# Patient Record
Sex: Female | Born: 1960 | Hispanic: No | Marital: Married | Smoking: Never smoker
Health system: Southern US, Community
[De-identification: ages and names within clinical notes are randomized; demographics above are authoritative.]

## PROBLEM LIST (undated history)

## (undated) DIAGNOSIS — D649 Anemia, unspecified: Secondary | ICD-10-CM

## (undated) DIAGNOSIS — F419 Anxiety disorder, unspecified: Secondary | ICD-10-CM

## (undated) DIAGNOSIS — N946 Dysmenorrhea, unspecified: Secondary | ICD-10-CM

## (undated) DIAGNOSIS — N644 Mastodynia: Secondary | ICD-10-CM

## (undated) DIAGNOSIS — N83209 Unspecified ovarian cyst, unspecified side: Secondary | ICD-10-CM

## (undated) DIAGNOSIS — Z87442 Personal history of urinary calculi: Secondary | ICD-10-CM

## (undated) HISTORY — DX: Mastodynia: N64.4

## (undated) HISTORY — DX: Unspecified ovarian cyst, unspecified side: N83.209

## (undated) HISTORY — DX: Anemia, unspecified: D64.9

## (undated) HISTORY — DX: Personal history of urinary calculi: Z87.442

## (undated) HISTORY — PX: SKIN BIOPSY: SHX1

## (undated) HISTORY — PX: WISDOM TOOTH EXTRACTION: SHX21

## (undated) HISTORY — DX: Dysmenorrhea, unspecified: N94.6

## (undated) HISTORY — DX: Anxiety disorder, unspecified: F41.9

---

## 1999-02-21 ENCOUNTER — Encounter: Payer: Self-pay | Admitting: Emergency Medicine

## 1999-02-21 ENCOUNTER — Emergency Department (HOSPITAL_COMMUNITY): Admission: EM | Admit: 1999-02-21 | Discharge: 1999-02-21 | Payer: Self-pay | Admitting: *Deleted

## 1999-04-18 ENCOUNTER — Other Ambulatory Visit: Admission: RE | Admit: 1999-04-18 | Discharge: 1999-04-18 | Payer: Self-pay | Admitting: Obstetrics and Gynecology

## 2000-09-17 ENCOUNTER — Encounter: Payer: Self-pay | Admitting: Internal Medicine

## 2000-09-17 ENCOUNTER — Encounter: Admission: RE | Admit: 2000-09-17 | Discharge: 2000-09-17 | Payer: Self-pay | Admitting: Internal Medicine

## 2000-09-18 ENCOUNTER — Other Ambulatory Visit: Admission: RE | Admit: 2000-09-18 | Discharge: 2000-09-18 | Payer: Self-pay | Admitting: Obstetrics and Gynecology

## 2002-01-05 ENCOUNTER — Encounter: Payer: Self-pay | Admitting: Obstetrics and Gynecology

## 2002-01-05 ENCOUNTER — Encounter: Admission: RE | Admit: 2002-01-05 | Discharge: 2002-01-05 | Payer: Self-pay | Admitting: Obstetrics and Gynecology

## 2002-01-24 ENCOUNTER — Other Ambulatory Visit: Admission: RE | Admit: 2002-01-24 | Discharge: 2002-01-24 | Payer: Self-pay | Admitting: Obstetrics and Gynecology

## 2002-12-17 ENCOUNTER — Emergency Department (HOSPITAL_COMMUNITY): Admission: EM | Admit: 2002-12-17 | Discharge: 2002-12-17 | Payer: Self-pay | Admitting: Emergency Medicine

## 2002-12-17 ENCOUNTER — Encounter: Payer: Self-pay | Admitting: Emergency Medicine

## 2003-01-23 ENCOUNTER — Encounter: Admission: RE | Admit: 2003-01-23 | Discharge: 2003-01-23 | Payer: Self-pay | Admitting: Obstetrics and Gynecology

## 2003-01-23 ENCOUNTER — Encounter: Payer: Self-pay | Admitting: Obstetrics and Gynecology

## 2003-02-27 ENCOUNTER — Other Ambulatory Visit: Admission: RE | Admit: 2003-02-27 | Discharge: 2003-02-27 | Payer: Self-pay | Admitting: Obstetrics and Gynecology

## 2004-02-22 ENCOUNTER — Encounter: Admission: RE | Admit: 2004-02-22 | Discharge: 2004-02-22 | Payer: Self-pay | Admitting: Obstetrics and Gynecology

## 2004-03-05 ENCOUNTER — Other Ambulatory Visit: Admission: RE | Admit: 2004-03-05 | Discharge: 2004-03-05 | Payer: Self-pay | Admitting: Obstetrics and Gynecology

## 2005-04-07 ENCOUNTER — Encounter: Admission: RE | Admit: 2005-04-07 | Discharge: 2005-04-07 | Payer: Self-pay | Admitting: Obstetrics and Gynecology

## 2005-04-14 ENCOUNTER — Other Ambulatory Visit: Admission: RE | Admit: 2005-04-14 | Discharge: 2005-04-14 | Payer: Self-pay | Admitting: Obstetrics and Gynecology

## 2006-02-27 ENCOUNTER — Ambulatory Visit (HOSPITAL_COMMUNITY): Admission: RE | Admit: 2006-02-27 | Discharge: 2006-02-27 | Payer: Self-pay | Admitting: Obstetrics and Gynecology

## 2006-04-14 ENCOUNTER — Encounter: Admission: RE | Admit: 2006-04-14 | Discharge: 2006-04-14 | Payer: Self-pay | Admitting: Obstetrics and Gynecology

## 2006-05-11 ENCOUNTER — Other Ambulatory Visit: Admission: RE | Admit: 2006-05-11 | Discharge: 2006-05-11 | Payer: Self-pay | Admitting: Obstetrics & Gynecology

## 2007-04-27 ENCOUNTER — Encounter: Admission: RE | Admit: 2007-04-27 | Discharge: 2007-04-27 | Payer: Self-pay | Admitting: Obstetrics and Gynecology

## 2007-07-08 ENCOUNTER — Other Ambulatory Visit: Admission: RE | Admit: 2007-07-08 | Discharge: 2007-07-08 | Payer: Self-pay | Admitting: Obstetrics & Gynecology

## 2008-04-27 ENCOUNTER — Encounter: Admission: RE | Admit: 2008-04-27 | Discharge: 2008-04-27 | Payer: Self-pay | Admitting: Obstetrics and Gynecology

## 2008-07-11 ENCOUNTER — Other Ambulatory Visit: Admission: RE | Admit: 2008-07-11 | Discharge: 2008-07-11 | Payer: Self-pay | Admitting: Obstetrics and Gynecology

## 2009-05-09 ENCOUNTER — Encounter: Admission: RE | Admit: 2009-05-09 | Discharge: 2009-05-09 | Payer: Self-pay | Admitting: Obstetrics and Gynecology

## 2010-05-30 ENCOUNTER — Other Ambulatory Visit: Payer: Self-pay | Admitting: Obstetrics & Gynecology

## 2010-05-30 DIAGNOSIS — Z1239 Encounter for other screening for malignant neoplasm of breast: Secondary | ICD-10-CM

## 2010-06-19 ENCOUNTER — Ambulatory Visit
Admission: RE | Admit: 2010-06-19 | Discharge: 2010-06-19 | Disposition: A | Discharge status: Home / Self Care | Payer: BC Managed Care – PPO | Source: Ambulatory Visit | Attending: Obstetrics & Gynecology | Admitting: Obstetrics & Gynecology

## 2010-06-19 DIAGNOSIS — Z1239 Encounter for other screening for malignant neoplasm of breast: Secondary | ICD-10-CM

## 2010-08-23 ENCOUNTER — Inpatient Hospital Stay (INDEPENDENT_AMBULATORY_CARE_PROVIDER_SITE_OTHER)
Admission: RE | Admit: 2010-08-23 | Discharge: 2010-08-23 | Disposition: A | Discharge status: Home / Self Care | Payer: BC Managed Care – PPO | Source: Ambulatory Visit | Attending: Family Medicine | Admitting: Family Medicine

## 2010-08-23 DIAGNOSIS — T148 Other injury of unspecified body region: Secondary | ICD-10-CM

## 2010-08-23 DIAGNOSIS — W57XXXA Bitten or stung by nonvenomous insect and other nonvenomous arthropods, initial encounter: Secondary | ICD-10-CM

## 2010-10-25 HISTORY — PX: COLONOSCOPY: SHX174

## 2011-05-27 ENCOUNTER — Other Ambulatory Visit: Payer: Self-pay | Admitting: Obstetrics & Gynecology

## 2011-05-27 DIAGNOSIS — Z1231 Encounter for screening mammogram for malignant neoplasm of breast: Secondary | ICD-10-CM

## 2011-06-24 ENCOUNTER — Ambulatory Visit: Payer: BC Managed Care – PPO

## 2011-06-25 ENCOUNTER — Ambulatory Visit
Admission: RE | Admit: 2011-06-25 | Discharge: 2011-06-25 | Disposition: A | Discharge status: Home / Self Care | Payer: BC Managed Care – PPO | Source: Ambulatory Visit | Attending: Obstetrics & Gynecology | Admitting: Obstetrics & Gynecology

## 2011-06-25 DIAGNOSIS — Z1231 Encounter for screening mammogram for malignant neoplasm of breast: Secondary | ICD-10-CM

## 2011-09-01 LAB — HM PAP SMEAR: HM Pap smear: NORMAL

## 2012-06-02 ENCOUNTER — Other Ambulatory Visit: Payer: Self-pay | Admitting: Obstetrics & Gynecology

## 2012-06-02 DIAGNOSIS — Z1231 Encounter for screening mammogram for malignant neoplasm of breast: Secondary | ICD-10-CM

## 2012-06-28 ENCOUNTER — Ambulatory Visit
Admission: RE | Admit: 2012-06-28 | Discharge: 2012-06-28 | Disposition: A | Discharge status: Home / Self Care | Payer: BC Managed Care – PPO | Source: Ambulatory Visit | Attending: Obstetrics & Gynecology | Admitting: Obstetrics & Gynecology

## 2012-06-28 DIAGNOSIS — Z1231 Encounter for screening mammogram for malignant neoplasm of breast: Secondary | ICD-10-CM

## 2012-06-28 LAB — HM MAMMOGRAPHY: HM Mammogram: NORMAL

## 2012-09-13 ENCOUNTER — Encounter: Payer: Self-pay | Admitting: *Deleted

## 2012-09-14 ENCOUNTER — Ambulatory Visit (INDEPENDENT_AMBULATORY_CARE_PROVIDER_SITE_OTHER): Payer: BC Managed Care – PPO | Admitting: Nurse Practitioner

## 2012-09-14 ENCOUNTER — Encounter: Payer: Self-pay | Admitting: Nurse Practitioner

## 2012-09-14 VITALS — BP 92/48 | HR 58 | Resp 12 | Ht 63.5 in | Wt 113.4 lb

## 2012-09-14 DIAGNOSIS — Z01419 Encounter for gynecological examination (general) (routine) without abnormal findings: Secondary | ICD-10-CM

## 2012-09-14 DIAGNOSIS — Z0000 Encounter for general adult medical examination without abnormal findings: Secondary | ICD-10-CM

## 2012-09-14 LAB — COMPREHENSIVE METABOLIC PANEL
ALT: 19 U/L (ref 0–35)
AST: 29 U/L (ref 0–37)
Albumin: 4.6 g/dL (ref 3.5–5.2)
Calcium: 10 mg/dL (ref 8.4–10.5)
Chloride: 103 mEq/L (ref 96–112)
Potassium: 4.5 mEq/L (ref 3.5–5.3)
Total Protein: 7 g/dL (ref 6.0–8.3)

## 2012-09-14 LAB — LIPID PANEL
LDL Cholesterol: 135 mg/dL — ABNORMAL HIGH (ref 0–99)
VLDL: 15 mg/dL (ref 0–40)

## 2012-09-14 LAB — POCT URINALYSIS DIPSTICK
Leukocytes, UA: NEGATIVE
Urobilinogen, UA: NEGATIVE
pH, UA: 6.5

## 2012-09-14 MED ORDER — ESTRADIOL 2 MG VA RING: 2.0000 mg | VAGINAL_RING | VAGINAL | Status: DC

## 2012-09-14 NOTE — Progress Notes (Signed)
52 y.o. G2P2 Married Caucasian Fe here for annual exam.   No menses in 4 years. No spotting.  Doing well with Estring. Sleep is fair.  Awakens frequently. Husband snores.  Patient's last menstrual period was 09/28/2008.          Sexually active: yes  The current method of family planning is post menopausal status, husband vasectomy    Exercising: yes  run, bike, tennis, yoga and weight training.  Smoker:  no  Health Maintenance: Pap:  09/01/2011  Normal with Neg HR HPV MMG:  06/2012 normal Colonoscopy:  10/25/2010 normal repeat in 10 years BMD:   never TDaP:  Within the last ten years, per pt.-2008  Labs: Hgb- 12.7   reports that she has never smoked. She has never used smokeless tobacco. She reports that she drinks about 0.6 ounces of alcohol per week. She reports that she does not use illicit drugs.  Past Medical History  Diagnosis Date  . H/O renal calculi   . Ovarian cyst   . Dysmenorrhea   . Anxiety     with air travel   . Anemia   . Mastalgia     Past Surgical History  Procedure Laterality Date  . Wisdom tooth extraction    . Colonoscopy  10/25/10    negative recheckin 10 years    Current Outpatient Prescriptions  Medication Sig Dispense Refill  . calcium carbonate (OS-CAL) 1250 MG chewable tablet Chew 1 tablet by mouth daily.      Marland Kitchen Dexlansoprazole (DEXILANT) 30 MG capsule Take 30 mg by mouth daily.      Marland Kitchen estradiol (ESTRING) 2 MG vaginal ring Place 2 mg vaginally every 3 (three) months. follow package directions      . ferrous fumarate (FERRO-SEQUELS) 50 MG CR tablet Take 50 mg by mouth daily.      . Multiple Vitamins-Minerals (MULTIVITAMIN PO) Take by mouth daily.       No current facility-administered medications for this visit.    Family History  Problem Relation Age of Onset  . Hypertension Mother   . Thyroid disease Mother   . Heart failure Paternal Uncle   . Heart failure Maternal Grandmother   . Stroke Paternal Grandmother     ROS:  Pertinent items  are noted in HPI.  Otherwise, a comprehensive ROS was negative.  Exam:   BP 92/48  Pulse 58  Resp 12  Ht 5' 3.5" (1.613 m)  Wt 113 lb 6.4 oz (51.438 kg)  BMI 19.77 kg/m2  LMP 09/28/2008 Height: 5' 3.5" (161.3 cm)  Ht Readings from Last 3 Encounters:  09/14/12 5' 3.5" (1.613 m)    General appearance: alert, cooperative and appears stated age Head: Normocephalic, without obvious abnormality, atraumatic Neck: no adenopathy, supple, symmetrical, trachea midline and thyroid normal to inspection and palpation Lungs: clear to auscultation bilaterally Breasts: normal appearance, no masses or tenderness Heart: regular rate and rhythm Abdomen: soft, non-tender; no masses,  no organomegaly Extremities: extremities normal, atraumatic, no cyanosis or edema Skin: Skin color, texture, turgor normal. No rashes or lesions Lymph nodes: Cervical, supraclavicular, and axillary nodes normal. No abnormal inguinal nodes palpated Neurologic: Grossly normal   Pelvic: External genitalia:  no lesions              Urethra:  normal appearing urethra with no masses, tenderness or lesions              Bartholin's and Skene's: normal  Vagina: normal appearing vagina with normal color and discharge, no lesions, Estring is removed              Cervix: anteverted              Pap taken: no Bimanual Exam:  Uterus:  normal size, contour, position, consistency, mobility, non-tender              Adnexa: no mass, fullness, tenderness               Rectovaginal: Confirms               Anus:  normal sphincter tone, no lesions  A:  Well Woman with normal exam ` Post menopausal  Atrophic Vaginitis on Estring    P:   Pap smear as per guidelines   Refill Estring for 1 year  Fasting labs - FLP, CMP, TSH  Mammogram due 2/15  counseled on breast self exam, adequate intake of calcium and vitamin D,   diet and exercise, Kegel's exercises return annually or prn  An After Visit Summary was printed and  given to the patient.

## 2012-09-14 NOTE — Patient Instructions (Signed)

## 2012-09-17 ENCOUNTER — Encounter: Payer: Self-pay | Admitting: Nurse Practitioner

## 2012-09-20 NOTE — Progress Notes (Signed)
Encounter reviewed by Dr. Brook Silva.  

## 2012-10-04 ENCOUNTER — Other Ambulatory Visit: Payer: Self-pay | Admitting: *Deleted

## 2012-10-04 NOTE — Telephone Encounter (Signed)
I have attempted to contact this patient by phone with the following results: left message to return my call on answering machine.

## 2012-10-04 NOTE — Telephone Encounter (Signed)
Since her med sheet did not flag Vit D RX I failed to ask her and did not get a Vit D level.  She may return here for a repeat Vit D or she can have it done at PCP.  According to new guidelines for last year with a level at 55 she would now be on OTC Vit D.  She may take OTC Vit D at 1000 IU daily if she desires. I just did not realize doing the computer charting that she was still on this. Sorry. PG

## 2012-10-04 NOTE — Telephone Encounter (Signed)
Faxed refill request received from pharmacy for VITAMIN D 32440 WEEKLY Last filled by MD on 09/01/11, #26 X 3 Last Vitamin D - 09/01/11 = 55 Last AEX - 09/14/12 Next AEX - 09/16/13 Please advise refills.

## 2012-10-05 NOTE — Telephone Encounter (Signed)
RC from pt.  She will take OTC Vitamin D.  Denied with pharmacy.

## 2013-03-10 ENCOUNTER — Other Ambulatory Visit: Payer: Self-pay

## 2013-05-02 ENCOUNTER — Encounter: Payer: Self-pay | Admitting: Nurse Practitioner

## 2013-05-24 ENCOUNTER — Other Ambulatory Visit: Payer: Self-pay

## 2013-05-24 DIAGNOSIS — Z1231 Encounter for screening mammogram for malignant neoplasm of breast: Secondary | ICD-10-CM

## 2013-06-30 ENCOUNTER — Ambulatory Visit: Payer: BC Managed Care – PPO

## 2013-07-04 ENCOUNTER — Ambulatory Visit
Admission: RE | Admit: 2013-07-04 | Discharge: 2013-07-04 | Disposition: A | Discharge status: Home / Self Care | Payer: BC Managed Care – PPO | Source: Ambulatory Visit

## 2013-07-04 DIAGNOSIS — Z1231 Encounter for screening mammogram for malignant neoplasm of breast: Secondary | ICD-10-CM

## 2013-09-16 ENCOUNTER — Ambulatory Visit: Payer: BC Managed Care – PPO | Admitting: Nurse Practitioner

## 2013-09-22 ENCOUNTER — Ambulatory Visit (INDEPENDENT_AMBULATORY_CARE_PROVIDER_SITE_OTHER): Payer: BC Managed Care – PPO | Admitting: Nurse Practitioner

## 2013-09-22 ENCOUNTER — Encounter: Payer: Self-pay | Admitting: Nurse Practitioner

## 2013-09-22 VITALS — BP 86/52 | HR 64 | Resp 16 | Ht 63.0 in | Wt 115.0 lb

## 2013-09-22 DIAGNOSIS — Z01419 Encounter for gynecological examination (general) (routine) without abnormal findings: Secondary | ICD-10-CM

## 2013-09-22 DIAGNOSIS — R82998 Other abnormal findings in urine: Secondary | ICD-10-CM

## 2013-09-22 DIAGNOSIS — Z0000 Encounter for general adult medical examination without abnormal findings: Secondary | ICD-10-CM

## 2013-09-22 DIAGNOSIS — R8290 Unspecified abnormal findings in urine: Secondary | ICD-10-CM

## 2013-09-22 DIAGNOSIS — N952 Postmenopausal atrophic vaginitis: Secondary | ICD-10-CM

## 2013-09-22 LAB — POCT URINALYSIS DIPSTICK
GLUCOSE UA: NEGATIVE
Ketones, UA: NEGATIVE
NITRITE UA: NEGATIVE
PROTEIN UA: NEGATIVE
UROBILINOGEN UA: NEGATIVE
pH, UA: 5

## 2013-09-22 MED ORDER — ESTRADIOL 2 MG VA RING: 2.0000 mg | VAGINAL_RING | VAGINAL | Status: DC

## 2013-09-22 NOTE — Progress Notes (Signed)
Patient ID: Wendy Medina, female   DOB: 01/28/1961, 53 y.o.   MRN: [id number] 53 y.o. G2P2 Married Caucasian Fe here for annual exam.  No vaginal spotting.  Help with atrophic vaginitis with Estring and wants to continue.   Patient and daughter just returned from a trip to [place] on Monday.  denies urinary symptoms.  Patient's last menstrual period was 09/28/2008.          Sexually active: yes  The current method of family planning is post menopausal status.    Exercising: yes  Running, biking, pilates, tennis weight training, and yoga Smoker:  no  Health Maintenance: Pap: 09/01/2011 Normal with Neg HR HPV  MMG: 07/04/13, Bi-Rads 1: negative Colonoscopy: 10/25/2010 normal repeat in 10 years  BMD: never  TDaP: Within the last ten years, per pt.-2008  Labs:  HB:  12.3  Urine:  2+leuk's, trace RBC    reports that she has never smoked. She has never used smokeless tobacco. She reports that she drinks about .6 ounces of alcohol per week. She reports that she does not use illicit drugs.  Past Medical History  Diagnosis Date  . H/O renal calculi   . Ovarian cyst   . Dysmenorrhea   . Anxiety     with air travel   . Anemia   . Mastalgia     Past Surgical History  Procedure Laterality Date  . Wisdom tooth extraction    . Colonoscopy  10/25/10    negative recheckin 10 years    Current Outpatient Prescriptions  Medication Sig Dispense Refill  . calcium carbonate (OS-CAL) 1250 MG chewable tablet Chew 1 tablet by mouth daily.      . cholecalciferol (VITAMIN D) 1000 UNITS tablet Take 1,000 Units by mouth daily.      Marland Kitchen. estradiol (ESTRING) 2 MG vaginal ring Place 2 mg vaginally every 3 (three) months. follow package directions  1 each  3  . ferrous fumarate (FERRO-SEQUELS) 50 MG CR tablet Take 50 mg by mouth daily.      . Multiple Vitamins-Minerals (MULTIVITAMIN PO) Take by mouth daily.      . pantoprazole (PROTONIX) 40 MG tablet Take 1 tablet by mouth daily.       No current  facility-administered medications for this visit.    Family History  Problem Relation Age of Onset  . Hypertension Mother   . Thyroid disease Mother   . Heart failure Paternal Uncle   . Heart failure Maternal Grandmother   . Stroke Paternal Grandmother     ROS:  Pertinent items are noted in HPI.  Otherwise, a comprehensive ROS was negative.  Exam:   BP 86/52  Pulse 64  Resp 16  Ht 5\' 3"  (1.6 m)  Wt 115 lb (52.164 kg)  BMI 20.38 kg/m2  LMP 09/28/2008 Height: 5\' 3"  (160 cm)  Ht Readings from Last 3 Encounters:  09/22/13 5\' 3"  (1.6 m)  09/14/12 5' 3.5" (1.613 m)    General appearance: alert, cooperative and appears stated age Head: Normocephalic, without obvious abnormality, atraumatic Neck: no adenopathy, supple, symmetrical, trachea midline and thyroid normal to inspection and palpation Lungs: clear to auscultation bilaterally Breasts: normal appearance, no masses or tenderness Heart: regular rate and rhythm Abdomen: soft, non-tender; no masses,  no organomegaly Extremities: extremities normal, atraumatic, no cyanosis or edema Skin: Skin color, texture, turgor normal. No rashes or lesions Lymph nodes: Cervical, supraclavicular, and axillary nodes normal. No abnormal inguinal nodes palpated Neurologic: Grossly normal   Pelvic: External  genitalia:  no lesions              Urethra:  normal appearing urethra with no masses, tenderness or lesions              Bartholin's and Skene's: normal                 Vagina: normal appearing vagina with normal color and discharge, no lesions              Cervix: anteverted              Pap taken: no Bimanual Exam:  Uterus:  normal size, contour, position, consistency, mobility, non-tender              Adnexa: no mass, fullness, tenderness               Rectovaginal: Confirms               Anus:  normal sphincter tone, no lesions  A:  Well Woman with normal exam  Postmenopausal - no HRT  Atrophic vaginitis  P:   Reviewed health  and wellness pertinent to exam  Pap smear not taken today  Refill on Estring for a year  Counseled on breast self exam, mammography screening, use and side effects of HRT, adequate intake of calcium and vitamin D, diet and exercise return annually or prn  An After Visit Summary was printed and given to the patient.

## 2013-09-22 NOTE — Patient Instructions (Signed)

## 2013-09-23 LAB — URINALYSIS, MICROSCOPIC ONLY
CASTS: NONE SEEN
Crystals: NONE SEEN

## 2013-09-23 LAB — HEMOGLOBIN, FINGERSTICK: HEMOGLOBIN, FINGERSTICK: 12.3 g/dL (ref 12.0–16.0)

## 2013-09-24 LAB — URINE CULTURE
COLONY COUNT: NO GROWTH
ORGANISM ID, BACTERIA: NO GROWTH

## 2013-09-27 NOTE — Progress Notes (Signed)
Encounter reviewed by Dr. Brook Silva.  

## 2013-10-11 ENCOUNTER — Telehealth: Payer: Self-pay | Admitting: Nurse Practitioner

## 2013-10-11 DIAGNOSIS — Z0000 Encounter for general adult medical examination without abnormal findings: Secondary | ICD-10-CM

## 2013-10-11 NOTE — Telephone Encounter (Signed)
Patient states she got a message on mychart that she needed to come back in and do a urine sample. i dont see anything

## 2013-10-11 NOTE — Telephone Encounter (Signed)
Spoke with patient. Patient would like to schedule repeat urine sample as advised by Patty through mychart. Nurse visit scheduled for tomorrow at 8:30am for repeat urine micro. Patient agreeable to date and time.  Routing to provider for final review. Patient agreeable to disposition. Will close encounter

## 2013-10-12 ENCOUNTER — Ambulatory Visit (INDEPENDENT_AMBULATORY_CARE_PROVIDER_SITE_OTHER): Payer: BC Managed Care – PPO | Admitting: *Deleted

## 2013-10-12 VITALS — BP 90/56 | HR 60 | Ht 63.0 in | Wt 118.0 lb

## 2013-10-12 DIAGNOSIS — Z0000 Encounter for general adult medical examination without abnormal findings: Secondary | ICD-10-CM

## 2013-10-12 NOTE — Progress Notes (Signed)
Patient in to repeat urine micro. Patient denies any sx.

## 2013-10-13 LAB — URINALYSIS, MICROSCOPIC ONLY
CRYSTALS: NONE SEEN
Casts: NONE SEEN

## 2014-03-06 ENCOUNTER — Encounter: Payer: Self-pay | Admitting: Nurse Practitioner

## 2014-06-08 ENCOUNTER — Other Ambulatory Visit: Payer: Self-pay

## 2014-06-08 DIAGNOSIS — Z1231 Encounter for screening mammogram for malignant neoplasm of breast: Secondary | ICD-10-CM

## 2014-07-06 ENCOUNTER — Ambulatory Visit
Admission: RE | Admit: 2014-07-06 | Discharge: 2014-07-06 | Disposition: A | Discharge status: Home / Self Care | Payer: BLUE CROSS/BLUE SHIELD | Source: Ambulatory Visit

## 2014-07-06 ENCOUNTER — Other Ambulatory Visit: Payer: Self-pay

## 2014-07-06 DIAGNOSIS — Z1231 Encounter for screening mammogram for malignant neoplasm of breast: Secondary | ICD-10-CM

## 2014-09-14 ENCOUNTER — Ambulatory Visit: Payer: Self-pay | Admitting: Podiatry

## 2014-09-14 ENCOUNTER — Encounter: Payer: Self-pay | Admitting: Podiatry

## 2014-09-14 ENCOUNTER — Ambulatory Visit (INDEPENDENT_AMBULATORY_CARE_PROVIDER_SITE_OTHER): Payer: BLUE CROSS/BLUE SHIELD | Admitting: Podiatry

## 2014-09-14 VITALS — BP 93/55 | HR 61 | Resp 15

## 2014-09-14 DIAGNOSIS — L600 Ingrowing nail: Secondary | ICD-10-CM

## 2014-09-14 DIAGNOSIS — B351 Tinea unguium: Secondary | ICD-10-CM

## 2014-09-14 NOTE — Progress Notes (Signed)
   Subjective:    Patient ID: Wendy Medina, female    DOB: 07/10/1960, 54 y.o.   MRN: [id number]  HPI Pt presents with right foot nail fungus and left great ingrown nail medial border   Review of Systems  All other systems reviewed and are negative.      Objective:   Physical Exam        Assessment & Plan:

## 2014-09-14 NOTE — Patient Instructions (Signed)

## 2014-09-17 NOTE — Progress Notes (Signed)
Subjective:     Patient ID: Wendy Medina, female   DOB: 09/30/1960, 54 y.o.   MRN: [id number]  HPI patient presents stating I have a painful ingrown toenail on my left foot and a my right foot I have yellow discoloration along with the third nail with thickness and yellow type debris   Review of Systems     Objective:   Physical Exam Neurovascular status intact muscle strength adequate with range of motion within normal limits. Patient's left hallux medial border is incurvated and sore when pressed and there is yellow discoloration of the hallux and third nail right with brittle-like appearance to the nailbeds    Assessment:     Ingrown toenail deformity left hallux medial border and mycotic fungal infection hallux and third nail right    Plan:     H&P and reviewed conditions. Due to the long-term nature of ingrown toenail I've recommended removal of corner and explained procedure and risk. Patient wants surgery and today I went ahead and I infiltrated the left hallux 60 mg Xylocaine Marcaine mixture remove the medial border exposed matrix and applied phenol 3 applications 30 seconds followed by alcohol lavage and sterile dressing. Gave instructions on soaks and reappoint and for the right recommended laser nail care and at this time scheduled and also we will begin topical formulas 3

## 2014-09-20 ENCOUNTER — Ambulatory Visit (INDEPENDENT_AMBULATORY_CARE_PROVIDER_SITE_OTHER): Payer: BLUE CROSS/BLUE SHIELD | Admitting: Podiatry

## 2014-09-20 ENCOUNTER — Ambulatory Visit: Payer: BLUE CROSS/BLUE SHIELD | Admitting: Podiatry

## 2014-09-20 DIAGNOSIS — B351 Tinea unguium: Secondary | ICD-10-CM

## 2014-09-21 ENCOUNTER — Other Ambulatory Visit: Payer: Self-pay | Admitting: Nurse Practitioner

## 2014-09-21 NOTE — Telephone Encounter (Signed)
Medication refill request: Estring 2 mg Last AEX:  09/22/13 with PG  Next AEX: 10/04/14 with PG  Last MMG (if hormonal medication request): 07/06/14 bi-rads 1; negative  Refill authorized: #1 ring/0 rfs, please advise.

## 2014-09-22 NOTE — Progress Notes (Signed)
Subjective:     Patient ID: Wendy Medina, female   DOB: 03/01/1961, 54 y.o.   MRN: [id number]  HPI patient presents with painful nail bed right hallux third that are yellow discolored   Review of Systems     Objective:   Physical Exam Vasco status intact with thickness of nailbeds hallux and right third    Assessment:     Mycotic nail infection    Plan:     Laser accomplished 1500 pulses tolerated well reappoint 6 weeks

## 2014-09-25 ENCOUNTER — Ambulatory Visit: Payer: BC Managed Care – PPO | Admitting: Nurse Practitioner

## 2014-10-04 ENCOUNTER — Encounter: Payer: Self-pay | Admitting: Nurse Practitioner

## 2014-10-04 ENCOUNTER — Ambulatory Visit (INDEPENDENT_AMBULATORY_CARE_PROVIDER_SITE_OTHER): Payer: BLUE CROSS/BLUE SHIELD | Admitting: Nurse Practitioner

## 2014-10-04 VITALS — BP 94/64 | HR 64 | Resp 18 | Ht 63.0 in | Wt 117.0 lb

## 2014-10-04 DIAGNOSIS — Z0000 Encounter for general adult medical examination without abnormal findings: Secondary | ICD-10-CM | POA: Diagnosis not present

## 2014-10-04 DIAGNOSIS — Z01419 Encounter for gynecological examination (general) (routine) without abnormal findings: Secondary | ICD-10-CM | POA: Diagnosis not present

## 2014-10-04 DIAGNOSIS — R8290 Unspecified abnormal findings in urine: Secondary | ICD-10-CM | POA: Diagnosis not present

## 2014-10-04 LAB — POCT URINALYSIS DIPSTICK
BILIRUBIN UA: NEGATIVE
Glucose, UA: NEGATIVE
Ketones, UA: NEGATIVE
Nitrite, UA: NEGATIVE
PH UA: 5
PROTEIN UA: NEGATIVE
RBC UA: NEGATIVE
Urobilinogen, UA: NEGATIVE

## 2014-10-04 MED ORDER — ESTRADIOL 2 MG VA RING: VAGINAL_RING | VAGINAL | Status: DC

## 2014-10-04 NOTE — Progress Notes (Signed)
54 y.o. G2P2 Married Caucasian Fe here for annual exam.  Doing very well with Estring.  No vaso symptoms.  Husband has just retired.  Stays very active.  Just returned from [place], [place], and [place].  Daughter who was studying abroad is now back in [place].  Patient's last menstrual period was 09/28/2008.          Sexually active: Yes.    The current method of family planning is post menopausal status.    Exercising: Yes.    Run, bike, tennis, weights Smoker:  no  Health Maintenance: Pap:  08/2011 Neg. HR HPV:Neg MMG: 07/06/14 BIRADS1:Neg Self Breast Check: No Colonoscopy:  2012 - normal repeat in 10 years  BMD:   Never TDaP: 09/14/2006 Labs: PCP   reports that she has never smoked. She has never used smokeless tobacco. She reports that she drinks about 0.6 oz of alcohol per week. She reports that she does not use illicit drugs.  Past Medical History  Diagnosis Date  . H/O renal calculi   . Ovarian cyst   . Dysmenorrhea   . Anxiety     with air travel   . Anemia   . Mastalgia     Past Surgical History  Procedure Laterality Date  . Wisdom tooth extraction    . Colonoscopy  10/25/10    negative recheckin 10 years    Current Outpatient Prescriptions  Medication Sig Dispense Refill  . cholecalciferol (VITAMIN D) 1000 UNITS tablet Take 1,000 Units by mouth daily.    Marland Kitchen estradiol (ESTRING) 2 MG vaginal ring PLACE VAGINALLY EVERY 3 MONTHS TAKE ACCORDING TO DIRECTIONS ON PACKAGE 1 each 4  . pantoprazole (PROTONIX) 40 MG tablet Take 1 tablet by mouth daily.    Marland Kitchen UNABLE TO FIND Allergy Injections once a month     No current facility-administered medications for this visit.    Family History  Problem Relation Age of Onset  . Hypertension Mother   . Thyroid disease Mother   . Heart failure Paternal Uncle   . Heart failure Maternal Grandmother   . Stroke Paternal Grandmother     ROS:  Pertinent items are noted in HPI.  Otherwise, a comprehensive ROS was negative.  Exam:    BP 94/64 mmHg  Pulse 64  Resp 18  Ht  (1.6 m)  Wt 117 lb (53.071 kg)  BMI 20.73 kg/m2  LMP 09/28/2008 Height:  (160 cm) Ht Readings from Last 3 Encounters:  10/04/14  (1.6 m)  10/12/13  (1.6 m)  09/22/13  (1.6 m)    General appearance: alert, cooperative and appears stated age Head: Normocephalic, without obvious abnormality, atraumatic Neck: no adenopathy, supple, symmetrical, trachea midline and thyroid normal to inspection and palpation Lungs: clear to auscultation bilaterally Breasts: normal appearance, no masses or tenderness Heart: regular rate and rhythm Abdomen: soft, non-tender; no masses,  no organomegaly Extremities: extremities normal, atraumatic, no cyanosis or edema Skin: Skin color, texture, turgor normal. No rashes or lesions Lymph nodes: Cervical, supraclavicular, and axillary nodes normal. No abnormal inguinal nodes palpated Neurologic: Grossly normal   Pelvic: External genitalia:  no lesions              Urethra:  normal appearing urethra with no masses, tenderness or lesions              Bartholin's and Skene's: normal                 Vagina: normal appearing  vagina with normal color and discharge, no lesions              Cervix: anteverted              Pap taken: Yes.   Bimanual Exam:  Uterus:  normal size, contour, position, consistency, mobility, non-tender              Adnexa: no mass, fullness, tenderness               Rectovaginal: Confirms               Anus:  normal sphincter tone, no lesions  Chaperone present: Yes  A:  Well Woman with normal exam  Postmenopausal - no HRT Atrophic vaginitis - on Estring   P:   Reviewed health and wellness pertinent to exam  Pap smear as above  Mammogram is due 3/17  Refill on Estring for a year  Counseled on risk of DVT, CVA, cancer, etc  Counseled on breast self exam, mammography screening, use and side effects of HRT, adequate intake of calcium and vitamin D, diet  and exercise return annually or prn  An After Visit Summary was printed and given to the patient.

## 2014-10-04 NOTE — Patient Instructions (Signed)

## 2014-10-05 LAB — URINALYSIS, MICROSCOPIC ONLY
Casts: NONE SEEN
Crystals: NONE SEEN

## 2014-10-05 LAB — URINE CULTURE
Colony Count: NO GROWTH
Organism ID, Bacteria: NO GROWTH

## 2014-10-06 ENCOUNTER — Other Ambulatory Visit: Payer: Self-pay | Admitting: *Deleted

## 2014-10-06 LAB — IPS PAP TEST WITH HPV

## 2014-10-06 NOTE — Telephone Encounter (Signed)
Left voicemail for pt to call back re: Lab results  Notes Recorded by Verner Choleborah S Leonard, CNM on 10/06/2014 at 10:50 AM Notify patient that urine culture was negative, but microscopic showed some squamous cell and WBC and some bacteria which may indicate a vaginal source for the results, she needs OV for affirm. Pleas schedule

## 2014-10-06 NOTE — Telephone Encounter (Signed)
Patient notified (See result note). 

## 2014-10-08 NOTE — Progress Notes (Signed)
Encounter reviewed by Dr. Brook Silva.  

## 2014-10-09 ENCOUNTER — Ambulatory Visit (INDEPENDENT_AMBULATORY_CARE_PROVIDER_SITE_OTHER): Payer: BLUE CROSS/BLUE SHIELD | Admitting: Nurse Practitioner

## 2014-10-09 VITALS — BP 104/60 | HR 66 | Ht 63.0 in | Wt 117.0 lb

## 2014-10-09 DIAGNOSIS — N760 Acute vaginitis: Secondary | ICD-10-CM | POA: Diagnosis not present

## 2014-10-09 NOTE — Patient Instructions (Signed)
Will call with results and treatment if needed.

## 2014-10-09 NOTE — Progress Notes (Signed)
54 y.o.Married white married female G2P2 here with no complaints of vaginal symptoms but had bacteria on her urine micro from her AEX on 10/04/14.  Her urine culture was negative.  She comes in now for an Affirm test to confirm if any vaginitis as the cause of abnormal urine.  She has no symptoms or complaints of infections.  She is still using the Estring for  Atrophic vaginitis.  Denies urinary symptoms as well. She saw PCP last week and will be having some recheck labs for a low WBC found on her routine screening.   O:Healthy female WDWN Affect: normal, orientation x 3  Exam: alert, and in no distress Lymph node: no enlargement or tenderness Pelvic exam: External genital: normal female without vulvar lesions or rash BUS: negative Vagina: thin clear discharge noted with her Estring. Affirm test is obtained  Affirm test is taken   A: Normal pelvic exam and AEX on 10/04/14  Abnormal urine with micro + bacteria  Normal urine culture  R/O Vaginitis as the  cause   P: Discussed findings of abnormal urine and etiology.  Will follow with the Affirm testing and treatment if needed.  RV prn

## 2014-10-10 ENCOUNTER — Other Ambulatory Visit: Payer: Self-pay | Admitting: Nurse Practitioner

## 2014-10-10 LAB — WET PREP BY MOLECULAR PROBE
Candida species: POSITIVE — AB
Gardnerella vaginalis: NEGATIVE
Trichomonas vaginosis: NEGATIVE

## 2014-10-10 MED ORDER — FLUCONAZOLE 150 MG PO TABS: 150.0000 mg | ORAL_TABLET | Freq: Once | ORAL | Status: DC

## 2014-10-11 ENCOUNTER — Encounter: Payer: Self-pay | Admitting: Nurse Practitioner

## 2014-10-11 NOTE — Progress Notes (Signed)
Encounter reviewed by Dr. Brook Silva.  

## 2014-10-30 ENCOUNTER — Ambulatory Visit (INDEPENDENT_AMBULATORY_CARE_PROVIDER_SITE_OTHER): Payer: BLUE CROSS/BLUE SHIELD | Admitting: Nurse Practitioner

## 2014-10-30 ENCOUNTER — Ambulatory Visit (INDEPENDENT_AMBULATORY_CARE_PROVIDER_SITE_OTHER): Payer: BLUE CROSS/BLUE SHIELD | Admitting: Podiatry

## 2014-10-30 ENCOUNTER — Encounter: Payer: Self-pay | Admitting: Nurse Practitioner

## 2014-10-30 VITALS — BP 90/56 | HR 68 | Temp 98.2°F | Ht 63.0 in

## 2014-10-30 DIAGNOSIS — R350 Frequency of micturition: Secondary | ICD-10-CM

## 2014-10-30 DIAGNOSIS — B351 Tinea unguium: Secondary | ICD-10-CM

## 2014-10-30 LAB — POCT URINALYSIS DIPSTICK
Bilirubin, UA: NEGATIVE
Blood, UA: NEGATIVE
GLUCOSE UA: NEGATIVE
Ketones, UA: NEGATIVE
NITRITE UA: NEGATIVE
Protein, UA: NEGATIVE
Urobilinogen, UA: NEGATIVE
pH, UA: 7

## 2014-10-30 MED ORDER — NITROFURANTOIN MONOHYD MACRO 100 MG PO CAPS: 100.0000 mg | ORAL_CAPSULE | Freq: Two times a day (BID) | ORAL | Status: DC

## 2014-10-30 NOTE — Progress Notes (Signed)
S:  54 y.o.Married white female presents with complaint of UTI. Symptoms began on  Friday.   She had symptoms of back pain, burning with urination, urinary frequency, urinary urgency, lower abdominal pressure.  . Pertinent negatives include The patient is having no constitutional symptoms, denying fever, chills, anorexia, or weight loss.. Sexually active yes  Symptoms related to post coital:  No   Menopausal yes with some vaginal dryness but that has improved with use of Estring.    Same partner without change. Last UTI documented over a year ago.  ROS: no weight loss, fever, night sweats and feels well  O alert, oriented to person, place, and time, normal mood, behavior, speech, dress, motor activity, and thought processes   thin, healthy,  alert,  not in acute distress, well developed and well nourished  Abdomen:  Mild pressure over the suprapubic  No CVA tenderness  Pelvic: deferred   Diagnostic Test:    Urinalysis: 3+ leuk's   PROCEDURES: urine culture and micro  Assessment: R/O UT  Plan:   Maintain adequate hydration. Follow up if symptoms not improving, and as needed.   Medication Therapy: Macrobid 100 mg BID # 14   Lab:  Call with urine results     RV

## 2014-10-30 NOTE — Patient Instructions (Signed)

## 2014-10-31 LAB — URINALYSIS, MICROSCOPIC ONLY
CRYSTALS: NONE SEEN
Casts: NONE SEEN

## 2014-10-31 LAB — URINE CULTURE
Colony Count: NO GROWTH
Organism ID, Bacteria: NO GROWTH

## 2014-10-31 NOTE — Progress Notes (Signed)
Encounter reviewed by Dr. Brook Amundson C. Silva.  

## 2014-11-01 ENCOUNTER — Telehealth: Payer: Self-pay | Admitting: *Deleted

## 2014-11-01 NOTE — Telephone Encounter (Signed)
Pt notified in result note.  Closing encounter. 

## 2014-11-01 NOTE — Telephone Encounter (Signed)
-----   Message from Verner Choleborah S Leonard, CNM sent at 11/01/2014  8:56 AM EDT ----- Notify patient that urine micro show low count of WBC only. Urine culture negative. Complete medication. If symptoms do not improve advise. Also if having some vaginal dryness, this can contribute to symptoms. Coconut oil can be used vaginally for dryness.

## 2014-11-01 NOTE — Telephone Encounter (Signed)
I have attempted to contact this patient by phone with the following results: left message to return call to Wendy Medina at [phone] on answering machine (mobile [facility]).  Pt name identified in voicemail, advised message regarding recent labs.  [phone] (Mobile) *Preferred*

## 2014-11-01 NOTE — Progress Notes (Signed)
Subjective:     Patient ID: Wendy Medina, female   DOB: 07/15/1960, 54 y.o.   MRN: [id number]  HPI patient presents stating that the nails seem to be a little bit improved but still present   Review of Systems     Objective:   Physical Exam Neurovascular status intact with yellowness of the hallux and third nail right localized in nature    Assessment:     Mycotic nail infection improving    Plan:     Laser applied to the area 1500 pulses tolerated well

## 2014-11-09 ENCOUNTER — Other Ambulatory Visit (HOSPITAL_BASED_OUTPATIENT_CLINIC_OR_DEPARTMENT_OTHER): Payer: BLUE CROSS/BLUE SHIELD

## 2014-11-09 ENCOUNTER — Telehealth: Payer: Self-pay | Admitting: Internal Medicine

## 2014-11-09 ENCOUNTER — Ambulatory Visit (HOSPITAL_BASED_OUTPATIENT_CLINIC_OR_DEPARTMENT_OTHER): Payer: BLUE CROSS/BLUE SHIELD | Admitting: Internal Medicine

## 2014-11-09 ENCOUNTER — Encounter: Payer: Self-pay | Admitting: Internal Medicine

## 2014-11-09 ENCOUNTER — Ambulatory Visit: Payer: BLUE CROSS/BLUE SHIELD

## 2014-11-09 ENCOUNTER — Other Ambulatory Visit: Payer: Self-pay | Admitting: Internal Medicine

## 2014-11-09 VITALS — BP 102/69 | HR 66 | Temp 98.4°F | Resp 18 | Ht 63.0 in | Wt 122.1 lb

## 2014-11-09 DIAGNOSIS — D72819 Decreased white blood cell count, unspecified: Secondary | ICD-10-CM | POA: Diagnosis not present

## 2014-11-09 DIAGNOSIS — D61818 Other pancytopenia: Secondary | ICD-10-CM

## 2014-11-09 LAB — CBC WITH DIFFERENTIAL/PLATELET
BASO%: 1.3 % (ref 0.0–2.0)
Basophils Absolute: 0 10*3/uL (ref 0.0–0.1)
EOS%: 1.8 % (ref 0.0–7.0)
Eosinophils Absolute: 0.1 10*3/uL (ref 0.0–0.5)
HCT: 38.9 % (ref 34.8–46.6)
HGB: 12.8 g/dL (ref 11.6–15.9)
LYMPH#: 1.2 10*3/uL (ref 0.9–3.3)
LYMPH%: 33.8 % (ref 14.0–49.7)
MCH: 30.3 pg (ref 25.1–34.0)
MCHC: 32.8 g/dL (ref 31.5–36.0)
MCV: 92.2 fL (ref 79.5–101.0)
MONO#: 0.3 10*3/uL (ref 0.1–0.9)
MONO%: 8.3 % (ref 0.0–14.0)
NEUT#: 2 10*3/uL (ref 1.5–6.5)
NEUT%: 54.8 % (ref 38.4–76.8)
Platelets: 171 10*3/uL (ref 145–400)
RBC: 4.21 10*6/uL (ref 3.70–5.45)
RDW: 13.5 % (ref 11.2–14.5)
WBC: 3.6 10*3/uL — ABNORMAL LOW (ref 3.9–10.3)

## 2014-11-09 LAB — COMPREHENSIVE METABOLIC PANEL (CC13)
ALT: 19 U/L (ref 0–55)
ANION GAP: 7 meq/L (ref 3–11)
AST: 28 U/L (ref 5–34)
Albumin: 4.3 g/dL (ref 3.5–5.0)
Alkaline Phosphatase: 83 U/L (ref 40–150)
BUN: 13.2 mg/dL (ref 7.0–26.0)
CO2: 28 meq/L (ref 22–29)
CREATININE: 0.9 mg/dL (ref 0.6–1.1)
Calcium: 9.9 mg/dL (ref 8.4–10.4)
Chloride: 106 mEq/L (ref 98–109)
EGFR: 72 mL/min/{1.73_m2} — ABNORMAL LOW (ref >90)
Glucose: 91 mg/dl (ref 70–140)
Potassium: 4.4 mEq/L (ref 3.5–5.1)
Sodium: 141 mEq/L (ref 136–145)
Total Bilirubin: 0.56 mg/dL (ref 0.20–1.20)
Total Protein: 7.2 g/dL (ref 6.4–8.3)

## 2014-11-09 LAB — IRON AND TIBC CHCC
%SAT: 34 % (ref 21–57)
Iron: 112 ug/dL (ref 41–142)
TIBC: 331 ug/dL (ref 236–444)
UIBC: 219 ug/dL (ref 120–384)

## 2014-11-09 LAB — HEPATITIS PANEL, ACUTE
HCV AB: NEGATIVE
HEP B S AG: NEGATIVE
Hep A IgM: NONREACTIVE
Hep B C IgM: NONREACTIVE

## 2014-11-09 LAB — FERRITIN CHCC: FERRITIN: 45 ng/mL (ref 9–269)

## 2014-11-09 LAB — RHEUMATOID FACTOR: Rhuematoid fact SerPl-aCnc: <10 IU/mL (ref <=14)

## 2014-11-09 LAB — LACTATE DEHYDROGENASE (CC13): LDH: 180 U/L (ref 125–245)

## 2014-11-09 NOTE — Telephone Encounter (Signed)
Referring Dr. Deatra JamesSun Vyvyan Dx-Thrombocytopenia/Dec'd WBC

## 2014-11-09 NOTE — Progress Notes (Signed)
Checked in new pt with no financial concerns. °

## 2014-11-09 NOTE — Progress Notes (Signed)
[place] [phone]   [phone]  CONSULT NOTE  REFERRING PHYSICIAN: Dr. Donald Prose.  REASON FOR CONSULTATION:  54 years old white female with leukocytopenia and thrombocytopenia.  HPI Wendy Medina is a 54 y.o. female with no significant past medical history except for dyslipidemia and recent treatment for urinary tract infection with Macrobid discontinued a few days ago. The patient was seen recently by her primary care physician for routine evaluation and she was noted to have persistent decreased white blood count. Her last CBC performed on 10/06/2014 showed total white blood count of 2.6 with absolute neutrophil count of 900. Her platelets count was also low at 143,000. The patient has normal hemoglobin of 12.9 and hematocrit 38.9%. No other previous labs are available for comparison. She was referred to me for evaluation and recommendation regarding her abnormal white blood count. The patient feels very well with no specific complaints. She exercises at regular basis and runs at least 5 miles 3-4 times a week. She denied having any new medication recently. She denied having any recent viral infection. She takes Advil occasionally as needed for pain management. She denied having any history of hepatitis or HIV. She denied having any rheumatologic abnormalities. The patient has no personal or family history of blood disease. On review of systems today, she denied having any significant weight loss or night sweats. She has no nausea or vomiting or change in her bowel movement. She denied having any significant chest pain, shortness of breath, cough or hemoptysis. She has no fever or chills. Family history mother had hypertension and diabetes mellitus. Father is healthy. No family history of blood disease. The patient is married and has 2 sons. She is a Agricultural engineer. She has no history of smoking and drinks alcohol occasionally and no history of drug  abuse.  HPI  Past Medical History  Diagnosis Date  . H/O renal calculi   . Ovarian cyst   . Dysmenorrhea   . Anxiety     with air travel   . Anemia   . Mastalgia     Past Surgical History  Procedure Laterality Date  . Wisdom tooth extraction    . Colonoscopy  10/25/10    negative recheckin 10 years    Family History  Problem Relation Age of Onset  . Hypertension Mother   . Thyroid disease Mother   . Heart failure Paternal Uncle   . Heart failure Maternal Grandmother   . Stroke Paternal Grandmother     Social History History  Substance Use Topics  . Smoking status: Never Smoker   . Smokeless tobacco: Never Used  . Alcohol Use: 0.6 oz/week    1 Glasses of wine per week     Comment: rarely    Allergies  Allergen Reactions  . Corn-Containing Products Shortness Of Breath  . Sulfa Antibiotics     Current Outpatient Prescriptions  Medication Sig Dispense Refill  . cholecalciferol (VITAMIN D) 1000 UNITS tablet Take 1,000 Units by mouth daily.    Marland Kitchen estradiol (ESTRING) 2 MG vaginal ring PLACE VAGINALLY EVERY 3 MONTHS TAKE ACCORDING TO DIRECTIONS ON PACKAGE 1 each 4  . pantoprazole (PROTONIX) 40 MG tablet Take 1 tablet by mouth daily.    Marland Kitchen UNABLE TO FIND Allergy Injections once a month    . fluconazole (DIFLUCAN) 150 MG tablet Take 1 tablet (150 mg total) by mouth once. Take one tablet.  Repeat in 48 hours if symptoms are not completely resolved. (  Patient not taking: Reported on 11/09/2014) 2 tablet 0  . nitrofurantoin, macrocrystal-monohydrate, (MACROBID) 100 MG capsule Take 1 capsule (100 mg total) by mouth 2 (two) times daily. (Patient not taking: Reported on 11/09/2014) 14 capsule 0   No current facility-administered medications for this visit.    Review of Systems  Constitutional: negative Eyes: negative Ears, nose, mouth, throat, and face: negative Respiratory: negative Cardiovascular: negative Gastrointestinal:  negative Genitourinary:negative Integument/breast: negative Hematologic/lymphatic: negative Musculoskeletal:negative Neurological: negative Behavioral/Psych: negative Endocrine: negative Allergic/Immunologic: negative  Physical Exam  JQZ:ESPQZ, healthy, no distress, well nourished, well developed and anxious SKIN: skin color, texture, turgor are normal, no rashes or significant lesions HEAD: Normocephalic, No masses, lesions, tenderness or abnormalities EYES: normal, PERRLA, Conjunctiva are pink and non-injected EARS: External ears normal, Canals clear OROPHARYNX:no exudate, no erythema and lips, buccal mucosa, and tongue normal  NECK: supple, no adenopathy, no JVD LYMPH:  no palpable lymphadenopathy, no hepatosplenomegaly BREAST:breasts appear normal, no suspicious masses, no skin or nipple changes or axillary nodes, not examined LUNGS: clear to auscultation , and palpation HEART: regular rate & rhythm and no murmurs ABDOMEN:abdomen soft, non-tender, normal bowel sounds and no masses or organomegaly BACK: Back symmetric, no curvature., No CVA tenderness EXTREMITIES:no joint deformities, effusion, or inflammation, no edema, no skin discoloration, no clubbing  NEURO: alert & oriented x 3 with fluent speech, no focal motor/sensory deficits  PERFORMANCE STATUS: ECOG 0  LABORATORY DATA: Lab Results  Component Value Date   WBC 3.6* 11/09/2014   HGB 12.8 11/09/2014   HCT 38.9 11/09/2014   MCV 92.2 11/09/2014   PLT 171 11/09/2014      Chemistry      Component Value Date/Time   NA 141 11/09/2014 1121   NA 141 09/14/2012 0917   K 4.4 11/09/2014 1121   K 4.5 09/14/2012 0917   CL 103 09/14/2012 0917   CO2 28 11/09/2014 1121   CO2 28 09/14/2012 0917   BUN 13.2 11/09/2014 1121   BUN 13 09/14/2012 0917   CREATININE 0.9 11/09/2014 1121   CREATININE 0.95 09/14/2012 0917      Component Value Date/Time   CALCIUM 9.9 11/09/2014 1121   CALCIUM 10.0 09/14/2012 0917   ALKPHOS 83  11/09/2014 1121   ALKPHOS 72 09/14/2012 0917   AST 28 11/09/2014 1121   AST 29 09/14/2012 0917   ALT 19 11/09/2014 1121   ALT 19 09/14/2012 0917   BILITOT 0.56 11/09/2014 1121   BILITOT 0.6 09/14/2012 0917       RADIOGRAPHIC STUDIES: No results found.  ASSESSMENT: This is a very pleasant 54 years old white female who presented for evaluation of persistent leukocytopenia and thrombocytopenia of unclear etiology but could be drug induced, immune mediated or secondary to recent infection. Her total white blood count has significantly improved compared to a few weeks ago. Her thrombocytopenia is completely resolved.  PLAN: I had a lengthy discussion with the patient today about her condition and the possible etiology for her leukocytopenia. I ordered several studies for evaluation of her condition including repeat CBC, comprehensive metabolic panel, LDH, iron study and ferritin, rheumatoid factor, ANA, acute hepatitis panel, serum folate as well as serum vitamin B 12. The patient is feeling fine and currently asymptomatic. I recommended for her to continue on observation with repeat CBC in one month for reevaluation of her condition. If she has any worsening of her leukocytopenia or thrombocytopenia, I may consider the patient for a bone marrow biopsy and aspirate to rule out any other  etiology. The patient was advised to call immediately if she has any concerning symptoms in the interval. The patient voices understanding of current disease status and treatment options and is in agreement with the current care plan.  All questions were answered. The patient knows to call the clinic with any problems, questions or concerns. We can certainly see the patient much sooner if necessary.  Thank you so much for allowing me to participate in the care of [place]. I will continue to follow up the patient with you and assist in her care.  I spent 30 minutes counseling the patient face to  face. The total time spent in the appointment was 55 minutes.  Disclaimer: This note was dictated with voice recognition software. Similar sounding words can inadvertently be transcribed and may not be corrected upon review.   Krisann Mckenna K. November 09, 2014, 12:44 PM

## 2014-11-09 NOTE — Telephone Encounter (Signed)
Pt confirmed labs/ov per 07/07 POF, pt declined AVS and Calendar states she uses Mychart... KJ

## 2014-11-10 LAB — ANA: Anti Nuclear Antibody(ANA): NEGATIVE

## 2014-11-10 LAB — FOLATE: Folate: 11.5 ng/mL

## 2014-11-10 LAB — VITAMIN B12: Vitamin B-12: 468 pg/mL (ref 211–911)

## 2014-11-29 ENCOUNTER — Ambulatory Visit: Payer: BLUE CROSS/BLUE SHIELD | Admitting: Podiatry

## 2014-12-07 ENCOUNTER — Telehealth: Payer: Self-pay | Admitting: Internal Medicine

## 2014-12-07 NOTE — Telephone Encounter (Signed)
returned pt call adn s.w pt that i cx her appt.

## 2014-12-11 ENCOUNTER — Ambulatory Visit: Payer: BLUE CROSS/BLUE SHIELD | Admitting: Internal Medicine

## 2014-12-11 ENCOUNTER — Other Ambulatory Visit: Payer: BLUE CROSS/BLUE SHIELD

## 2015-02-26 ENCOUNTER — Ambulatory Visit: Payer: BLUE CROSS/BLUE SHIELD | Admitting: Podiatry

## 2015-03-14 ENCOUNTER — Ambulatory Visit: Payer: BLUE CROSS/BLUE SHIELD | Admitting: Podiatry

## 2015-04-04 ENCOUNTER — Ambulatory Visit: Payer: BLUE CROSS/BLUE SHIELD | Admitting: Podiatry

## 2015-04-04 ENCOUNTER — Ambulatory Visit (INDEPENDENT_AMBULATORY_CARE_PROVIDER_SITE_OTHER): Payer: BLUE CROSS/BLUE SHIELD | Admitting: Podiatry

## 2015-04-04 DIAGNOSIS — B351 Tinea unguium: Secondary | ICD-10-CM

## 2015-04-04 MED ORDER — TERBINAFINE HCL 250 MG PO TABS: ORAL_TABLET | ORAL | Status: DC

## 2015-04-04 NOTE — Progress Notes (Signed)
Subjective:     Patient ID: Wendy Medina, female   DOB: 12/10/1960, 54 y.o.   MRN: [id number]  HPI patient states that my toenails are improving   Review of Systems     Objective:   Physical Exam Hallux third nail right are improving still slight thickness on the third nail    Assessment:     Mycotic nail infection improving    Plan:     Did start on a pulse Lamisil for 4 months and advised on continued topical and laser applied today 1000 pulses which was tolerated well

## 2015-05-30 ENCOUNTER — Encounter: Payer: Self-pay | Admitting: Nurse Practitioner

## 2015-05-30 ENCOUNTER — Ambulatory Visit (INDEPENDENT_AMBULATORY_CARE_PROVIDER_SITE_OTHER): Payer: BLUE CROSS/BLUE SHIELD | Admitting: Nurse Practitioner

## 2015-05-30 VITALS — BP 110/66 | HR 64 | Temp 98.0°F | Ht 63.0 in | Wt 124.0 lb

## 2015-05-30 DIAGNOSIS — R300 Dysuria: Secondary | ICD-10-CM

## 2015-05-30 DIAGNOSIS — R350 Frequency of micturition: Secondary | ICD-10-CM | POA: Diagnosis not present

## 2015-05-30 LAB — POCT URINALYSIS DIPSTICK
BILIRUBIN UA: NEGATIVE
Glucose, UA: NEGATIVE
KETONES UA: NEGATIVE
Nitrite, UA: NEGATIVE
PH UA: 6
Protein, UA: NEGATIVE
Urobilinogen, UA: NEGATIVE

## 2015-05-30 MED ORDER — NITROFURANTOIN MONOHYD MACRO 100 MG PO CAPS: 100.0000 mg | ORAL_CAPSULE | Freq: Two times a day (BID) | ORAL | Status: DC

## 2015-05-30 NOTE — Patient Instructions (Addendum)
Urinary Tract Infection Urinary tract infections (UTIs) can develop anywhere along your urinary tract. Your urinary tract is your body's drainage system for removing wastes and extra water. Your urinary tract includes two kidneys, two ureters, a bladder, and a urethra. Your kidneys are a pair of bean-shaped organs. Each kidney is about the size of your fist. They are located below your ribs, one on each side of your spine. CAUSES Infections are caused by microbes, which are microscopic organisms, including fungi, viruses, and bacteria. These organisms are so small that they can only be seen through a microscope. Bacteria are the microbes that most commonly cause UTIs. SYMPTOMS  Symptoms of UTIs may vary by age and gender of the patient and by the location of the infection. Symptoms in young women typically include a frequent and intense urge to urinate and a painful, burning feeling in the bladder or urethra during urination. Older women and men are more likely to be tired, shaky, and weak and have muscle aches and abdominal pain. A fever may mean the infection is in your kidneys. Other symptoms of a kidney infection include pain in your back or sides below the ribs, nausea, and vomiting. DIAGNOSIS To diagnose a UTI, your caregiver will ask you about your symptoms. Your caregiver will also ask you to provide a urine sample. The urine sample will be tested for bacteria and white blood cells. White blood cells are made by your body to help fight infection. TREATMENT  Typically, UTIs can be treated with medication. Because most UTIs are caused by a bacterial infection, they usually can be treated with the use of antibiotics. The choice of antibiotic and length of treatment depend on your symptoms and the type of bacteria causing your infection. HOME CARE INSTRUCTIONS  If you were prescribed antibiotics, take them exactly as your caregiver instructs you. Finish the medication even if you feel better after  you have only taken some of the medication.  Drink enough water and fluids to keep your urine clear or pale yellow.  Avoid caffeine, tea, and carbonated beverages. They tend to irritate your bladder.  Empty your bladder often. Avoid holding urine for long periods of time.  Empty your bladder before and after sexual intercourse.  After a bowel movement, women should cleanse from front to back. Use each tissue only once. SEEK MEDICAL CARE IF:   You have back pain.  You develop a fever.  Your symptoms do not begin to resolve within 3 days. SEEK IMMEDIATE MEDICAL CARE IF:   You have severe back pain or lower abdominal pain.  You develop chills.  You have nausea or vomiting.  You have continued burning or discomfort with urination. MAKE SURE YOU:   Understand these instructions.  Will watch your condition.  Will get help right away if you are not doing well or get worse.   This information is not intended to replace advice given to you by your health care provider. Make sure you discuss any questions you have with your health care provider.   Document Released: 01/29/2005 Document Revised: 01/10/2015 Document Reviewed: 05/30/2011 Elsevier Interactive Patient Education 2016 [employer].   Urethritis, Adult Urethritis is an inflammation of the tube through which urine exits your bladder (urethra).  CAUSES Urethritis is often caused by an infection in your urethra. The infection can be viral, like herpes. The infection can also be bacterial, like gonorrhea. RISK FACTORS Risk factors of urethritis include:  Having sex without using a condom.  Having  multiple sexual partners.  Having poor hygiene. SIGNS AND SYMPTOMS Symptoms of urethritis are less noticeable in women than in men. These symptoms include:  Burning feeling when you urinate (dysuria).  Discharge from your urethra.  Blood in your urine (hematuria).  Urinating more than usual. DIAGNOSIS  To confirm a  diagnosis of urethritis, your health care provider will do the following:  Ask about your sexual history.  Perform a physical exam.  Have you provide a sample of your urine for lab testing.  Use a cotton swab to gently collect a sample from your urethra for lab testing. TREATMENT  It is important to treat urethritis. Depending on the cause, untreated urethritis may lead to serious genital infections and possibly infertility. Urethritis caused by a bacterial infection is treated with antibiotic medicine. All sexual partners must be treated.  HOME CARE INSTRUCTIONS  Do not have sex until the test results are known and treatment is completed, even if your symptoms go away before you finish treatment.  If you were prescribed an antibiotic, finish it all even if you start to feel better. SEEK MEDICAL CARE IF:   Your symptoms are not improved in 3 days.  Your symptoms are getting worse.  You develop abdominal pain or pelvic pain (in women).  You develop joint pain.  You have a fever. SEEK IMMEDIATE MEDICAL CARE IF:   You have severe pain in the belly, back, or side.  You have repeated vomiting. MAKE SURE YOU:  Understand these instructions.  Will watch your condition.  Will get help right away if you are not doing well or get worse.   This information is not intended to replace advice given to you by your health care provider. Make sure you discuss any questions you have with your health care provider.   Document Released: 10/15/2000 Document Revised: 09/05/2014 Document Reviewed: 12/20/2012 Elsevier Interactive Patient Education [employer]

## 2015-05-30 NOTE — Progress Notes (Signed)
Patient ID: NOVIS LEAGUE, female   DOB: 13-Nov-1960, 55 y.o.   MRN: [id number]  55 y.o.Married Caucasian female G2P2 here with complaint of UTI, with onset  on 05/26/15. Patient complaining of:  dysuria, urinary frequency and urinary urgency. Patient denies fever, chills, nausea or back pain. No new personal products. Patient feels not to sexual activity. Denies vaginal symptoms.    Contraception is postmenopausal.  No change in partner. Menopausal with vaginal dryness. Patient does have adequate water intake.   Went on 6 hour bike trip 05/15/15 in [place] without her bike shorts and she thinks this was the onset of pressure.  Symptoms getting worse daily with increase lower pelvic pressure.   O: Healthy female WDWN Affect: Normal, orientation x 3 Skin : warm and dry CVAT: negative bilateral Abdomen: positive for suprapubic tenderness  Pelvic exam: not indicated  POCT:  1+ Leuk's, Small RBC  A:  UTI vs. Urethritis  Avid biker with a recent 6 hour trip  P: Reviewed findings of UTI and need for treatment. Rx:  Macrobid 100 mg BID until culture is back - she is given extra medication to use after a bike trip if she gets symptoms ION:GEXBM micro, culture Reviewed warning signs and symptoms of UTI and need to advise if occurring. Encouraged to limit soda, tea, and coffee   RV prn

## 2015-05-30 NOTE — Progress Notes (Deleted)
55 y.o.Married Caucasian female G2P2 here with complaint of UTI, with onset  on ***. Patient complaining of:  {UTI Symptoms:210800002}. Patient denies fever, chills, nausea or back pain. No new personal products. Patient feels *** to sexual activity. Denies vaginal symptoms.    Contraception is ***.  **** change in partner. *** Menopausal with vaginal dryness. Patient *** adequate water intake   O: Healthy female WDWN Affect: Normal, orientation x 3 Skin : warm and dry CVAT: *** bilateral Abdomen: *** for suprapubic tenderness  Pelvic exam: External genital area: normal, no lesions Bladder,Urethra tender, Urethral meatus: normal Vagina: normal vaginal discharge, normal appearance Cervix: normal, non tender Uterus:normal,non tender Adnexa: normal non tender, no fullness or masses  POCT:  ***  A:  UTI  Normal pelvic exam  P: Reviewed findings of UTI and need for treatment. Rx:  *** ZOX:WRUEA micro, culture Reviewed warning signs and symptoms of UTI and need to advise if occurring. Encouraged to limit soda, tea, and coffee   RV prn

## 2015-05-31 LAB — URINALYSIS, MICROSCOPIC ONLY
BACTERIA UA: NONE SEEN [HPF]
CRYSTALS: NONE SEEN [HPF]
Casts: NONE SEEN [LPF]
YEAST: NONE SEEN [HPF]

## 2015-06-01 LAB — URINE CULTURE: Colony Count: 75000

## 2015-06-03 NOTE — Progress Notes (Signed)
Encounter reviewed by Dr. Abshir Paolini Amundson C. Silva.  

## 2015-06-22 ENCOUNTER — Other Ambulatory Visit: Payer: Self-pay

## 2015-06-22 DIAGNOSIS — Z1231 Encounter for screening mammogram for malignant neoplasm of breast: Secondary | ICD-10-CM

## 2015-07-09 ENCOUNTER — Ambulatory Visit
Admission: RE | Admit: 2015-07-09 | Discharge: 2015-07-09 | Disposition: A | Discharge status: Home / Self Care | Payer: BLUE CROSS/BLUE SHIELD | Source: Ambulatory Visit

## 2015-07-09 DIAGNOSIS — Z1231 Encounter for screening mammogram for malignant neoplasm of breast: Secondary | ICD-10-CM

## 2015-08-08 DIAGNOSIS — J301 Allergic rhinitis due to pollen: Secondary | ICD-10-CM | POA: Diagnosis not present

## 2015-08-10 DIAGNOSIS — J029 Acute pharyngitis, unspecified: Secondary | ICD-10-CM | POA: Diagnosis not present

## 2015-09-10 DIAGNOSIS — J301 Allergic rhinitis due to pollen: Secondary | ICD-10-CM | POA: Diagnosis not present

## 2015-09-27 ENCOUNTER — Other Ambulatory Visit: Payer: Self-pay | Admitting: Family Medicine

## 2015-09-27 ENCOUNTER — Ambulatory Visit
Admission: RE | Admit: 2015-09-27 | Discharge: 2015-09-27 | Disposition: A | Discharge status: Home / Self Care | Payer: BLUE CROSS/BLUE SHIELD | Source: Ambulatory Visit | Attending: Family Medicine | Admitting: Family Medicine

## 2015-09-27 DIAGNOSIS — J301 Allergic rhinitis due to pollen: Secondary | ICD-10-CM | POA: Diagnosis not present

## 2015-09-27 DIAGNOSIS — R05 Cough: Secondary | ICD-10-CM

## 2015-09-27 DIAGNOSIS — R0602 Shortness of breath: Secondary | ICD-10-CM | POA: Diagnosis not present

## 2015-09-27 DIAGNOSIS — R059 Cough, unspecified: Secondary | ICD-10-CM

## 2015-09-27 DIAGNOSIS — K219 Gastro-esophageal reflux disease without esophagitis: Secondary | ICD-10-CM | POA: Diagnosis not present

## 2015-10-22 DIAGNOSIS — E7800 Pure hypercholesterolemia, unspecified: Secondary | ICD-10-CM | POA: Diagnosis not present

## 2015-10-22 DIAGNOSIS — J301 Allergic rhinitis due to pollen: Secondary | ICD-10-CM | POA: Diagnosis not present

## 2015-10-24 ENCOUNTER — Encounter: Payer: Self-pay | Admitting: Nurse Practitioner

## 2015-10-24 ENCOUNTER — Ambulatory Visit (INDEPENDENT_AMBULATORY_CARE_PROVIDER_SITE_OTHER): Payer: BLUE CROSS/BLUE SHIELD | Admitting: Nurse Practitioner

## 2015-10-24 VITALS — BP 100/60 | HR 68 | Resp 12 | Ht 63.0 in | Wt 120.2 lb

## 2015-10-24 DIAGNOSIS — Z0000 Encounter for general adult medical examination without abnormal findings: Secondary | ICD-10-CM

## 2015-10-24 DIAGNOSIS — Z01419 Encounter for gynecological examination (general) (routine) without abnormal findings: Secondary | ICD-10-CM | POA: Diagnosis not present

## 2015-10-24 DIAGNOSIS — N952 Postmenopausal atrophic vaginitis: Secondary | ICD-10-CM | POA: Diagnosis not present

## 2015-10-24 DIAGNOSIS — M7071 Other bursitis of hip, right hip: Secondary | ICD-10-CM | POA: Diagnosis not present

## 2015-10-24 DIAGNOSIS — M9905 Segmental and somatic dysfunction of pelvic region: Secondary | ICD-10-CM | POA: Diagnosis not present

## 2015-10-24 DIAGNOSIS — M9906 Segmental and somatic dysfunction of lower extremity: Secondary | ICD-10-CM | POA: Diagnosis not present

## 2015-10-24 DIAGNOSIS — M791 Myalgia: Secondary | ICD-10-CM | POA: Diagnosis not present

## 2015-10-24 DIAGNOSIS — M7652 Patellar tendinitis, left knee: Secondary | ICD-10-CM | POA: Diagnosis not present

## 2015-10-24 DIAGNOSIS — M7651 Patellar tendinitis, right knee: Secondary | ICD-10-CM | POA: Diagnosis not present

## 2015-10-24 MED ORDER — ESTRADIOL 2 MG VA RING: VAGINAL_RING | VAGINAL | Status: DC

## 2015-10-24 NOTE — Patient Instructions (Signed)

## 2015-10-24 NOTE — Progress Notes (Signed)
Encounter reviewed Hira Trent, MD   

## 2015-10-24 NOTE — Progress Notes (Signed)
55 y.o. G2P2 Married  Caucasian Fe here for annual exam.  Episode of chronic cough  for 7 weeks and had a negative CXR.  Treated with Dexilant daily for 10 days and symptoms have resolved.  No further bladder symptoms.  Doing well on Estring.  Patient's last menstrual period was 09/28/2008 (exact date).          Sexually active: Yes.    The current method of family planning is post menopausal status.    Exercising: Yes.    Biking, Running, Tennis, Yoga, Pilates Smoker:  no  Health Maintenance: Pap:  10/04/14-WNL neg HR HPV MMG:  07/09/15-BIRADS1 Colonoscopy:  10/15/10-Normal. Repeat 10 yrs TDaP:  03/2015 Hep C  Done 11/09/2014 and HIV: done 1989 Labs: PCP-done on 10/22/15   reports that she has never smoked. She has never used smokeless tobacco. She reports that she drinks about 0.6 oz of alcohol per week. She reports that she does not use illicit drugs.  Past Medical History  Diagnosis Date  . H/O renal calculi   . Ovarian cyst   . Dysmenorrhea   . Anxiety     with air travel   . Anemia   . Mastalgia     Past Surgical History  Procedure Laterality Date  . Wisdom tooth extraction    . Colonoscopy  10/25/10    negative recheckin 10 years    Current Outpatient Prescriptions  Medication Sig Dispense Refill  . cholecalciferol (VITAMIN D) 1000 UNITS tablet Take 1,000 Units by mouth daily.    Marland Kitchen. estradiol (ESTRING) 2 MG vaginal ring PLACE VAGINALLY EVERY 3 MONTHS TAKE ACCORDING TO DIRECTIONS ON PACKAGE 1 each 4  . pantoprazole (PROTONIX) 40 MG tablet Take 20 mg by mouth daily.     Marland Kitchen. terbinafine (LAMISIL) 250 MG tablet Take one tablet once daily x 7 days, then repeat every month x 4 months 28 tablet 0  . UNABLE TO FIND Allergy Injections once a month     No current facility-administered medications for this visit.    Family History  Problem Relation Age of Onset  . Hypertension Mother   . Thyroid disease Mother   . Heart failure Paternal Uncle   . Heart failure Maternal Grandmother    . Stroke Paternal Grandmother     ROS:  Pertinent items are noted in HPI.  Otherwise, a comprehensive ROS was negative.  Exam:   BP 100/60 mmHg  Pulse 68  Resp 12  Ht 5\' 3"  (1.6 m)  Wt 120 lb 3.2 oz (54.522 kg)  BMI 21.30 kg/m2  LMP 09/28/2008 (Exact Date) Height: 5\' 3"  (160 cm) Ht Readings from Last 3 Encounters:  10/24/15 5\' 3"  (1.6 m)  05/30/15 5\' 3"  (1.6 m)  11/09/14 5\' 3"  (1.6 m)    General appearance: alert, cooperative and appears stated age Head: Normocephalic, without obvious abnormality, atraumatic Neck: no adenopathy, supple, symmetrical, trachea midline and thyroid normal to inspection and palpation Lungs: clear to auscultation bilaterally Breasts: normal appearance, no masses or tenderness Heart: regular rate and rhythm Abdomen: soft, non-tender; no masses,  no organomegaly Extremities: extremities normal, atraumatic, no cyanosis or edema Skin: Skin color, texture, turgor normal. No rashes or lesions Lymph nodes: Cervical, supraclavicular, and axillary nodes normal. No abnormal inguinal nodes palpated Neurologic: Grossly normal   Pelvic: External genitalia:  no lesions              Urethra:  normal appearing urethra with no masses, tenderness or lesions  Bartholin's and Skene's: normal                 Vagina: normal appearing vagina with normal color and discharge, no lesions.  Estring in place              Cervix: anteverted              Pap taken: No. Bimanual Exam:  Uterus:  normal size, contour, position, consistency, mobility, non-tender              Adnexa: no mass, fullness, tenderness               Rectovaginal: Confirms               Anus:  normal sphincter tone, no lesions  Chaperone present: no  A:  Well Woman with normal exam  Postmenopausal - no HRT Atrophic vaginitis - on Estring   P:   Reviewed health and wellness pertinent to exam  Pap smear as above  Mammogram is due 07/2016  Refill on Estring for a  year  Counseled on breast self exam, mammography screening, use and side effects of HRT, adequate intake of calcium and vitamin D, diet and exercise, Kegel's exercises return annually or prn  An After Visit Summary was printed and given to the patient.

## 2015-10-26 DIAGNOSIS — M9906 Segmental and somatic dysfunction of lower extremity: Secondary | ICD-10-CM | POA: Diagnosis not present

## 2015-10-26 DIAGNOSIS — M7652 Patellar tendinitis, left knee: Secondary | ICD-10-CM | POA: Diagnosis not present

## 2015-10-26 DIAGNOSIS — M7071 Other bursitis of hip, right hip: Secondary | ICD-10-CM | POA: Diagnosis not present

## 2015-10-26 DIAGNOSIS — M9905 Segmental and somatic dysfunction of pelvic region: Secondary | ICD-10-CM | POA: Diagnosis not present

## 2015-10-26 DIAGNOSIS — M7651 Patellar tendinitis, right knee: Secondary | ICD-10-CM | POA: Diagnosis not present

## 2015-10-26 DIAGNOSIS — M791 Myalgia: Secondary | ICD-10-CM | POA: Diagnosis not present

## 2015-12-06 DIAGNOSIS — J301 Allergic rhinitis due to pollen: Secondary | ICD-10-CM | POA: Diagnosis not present

## 2015-12-27 DIAGNOSIS — M9905 Segmental and somatic dysfunction of pelvic region: Secondary | ICD-10-CM | POA: Diagnosis not present

## 2015-12-27 DIAGNOSIS — M791 Myalgia: Secondary | ICD-10-CM | POA: Diagnosis not present

## 2015-12-27 DIAGNOSIS — M7071 Other bursitis of hip, right hip: Secondary | ICD-10-CM | POA: Diagnosis not present

## 2015-12-27 DIAGNOSIS — M9906 Segmental and somatic dysfunction of lower extremity: Secondary | ICD-10-CM | POA: Diagnosis not present

## 2015-12-27 DIAGNOSIS — M7652 Patellar tendinitis, left knee: Secondary | ICD-10-CM | POA: Diagnosis not present

## 2015-12-27 DIAGNOSIS — M7651 Patellar tendinitis, right knee: Secondary | ICD-10-CM | POA: Diagnosis not present

## 2016-01-02 DIAGNOSIS — M9906 Segmental and somatic dysfunction of lower extremity: Secondary | ICD-10-CM | POA: Diagnosis not present

## 2016-01-02 DIAGNOSIS — M9905 Segmental and somatic dysfunction of pelvic region: Secondary | ICD-10-CM | POA: Diagnosis not present

## 2016-01-02 DIAGNOSIS — M7651 Patellar tendinitis, right knee: Secondary | ICD-10-CM | POA: Diagnosis not present

## 2016-01-02 DIAGNOSIS — M791 Myalgia: Secondary | ICD-10-CM | POA: Diagnosis not present

## 2016-01-02 DIAGNOSIS — M7652 Patellar tendinitis, left knee: Secondary | ICD-10-CM | POA: Diagnosis not present

## 2016-01-02 DIAGNOSIS — M7071 Other bursitis of hip, right hip: Secondary | ICD-10-CM | POA: Diagnosis not present

## 2016-01-09 DIAGNOSIS — M7652 Patellar tendinitis, left knee: Secondary | ICD-10-CM | POA: Diagnosis not present

## 2016-01-09 DIAGNOSIS — M9905 Segmental and somatic dysfunction of pelvic region: Secondary | ICD-10-CM | POA: Diagnosis not present

## 2016-01-09 DIAGNOSIS — M9906 Segmental and somatic dysfunction of lower extremity: Secondary | ICD-10-CM | POA: Diagnosis not present

## 2016-01-09 DIAGNOSIS — M7651 Patellar tendinitis, right knee: Secondary | ICD-10-CM | POA: Diagnosis not present

## 2016-01-09 DIAGNOSIS — M7071 Other bursitis of hip, right hip: Secondary | ICD-10-CM | POA: Diagnosis not present

## 2016-01-09 DIAGNOSIS — M791 Myalgia: Secondary | ICD-10-CM | POA: Diagnosis not present

## 2016-01-16 DIAGNOSIS — J301 Allergic rhinitis due to pollen: Secondary | ICD-10-CM | POA: Diagnosis not present

## 2016-02-27 DIAGNOSIS — J301 Allergic rhinitis due to pollen: Secondary | ICD-10-CM | POA: Diagnosis not present

## 2016-03-03 DIAGNOSIS — Z23 Encounter for immunization: Secondary | ICD-10-CM | POA: Diagnosis not present

## 2016-03-18 DIAGNOSIS — J301 Allergic rhinitis due to pollen: Secondary | ICD-10-CM | POA: Diagnosis not present

## 2016-04-11 DIAGNOSIS — J301 Allergic rhinitis due to pollen: Secondary | ICD-10-CM | POA: Diagnosis not present

## 2016-04-14 DIAGNOSIS — J301 Allergic rhinitis due to pollen: Secondary | ICD-10-CM | POA: Diagnosis not present

## 2016-04-14 DIAGNOSIS — L650 Telogen effluvium: Secondary | ICD-10-CM | POA: Diagnosis not present

## 2016-04-14 DIAGNOSIS — Z79899 Other long term (current) drug therapy: Secondary | ICD-10-CM | POA: Diagnosis not present

## 2016-04-17 DIAGNOSIS — J301 Allergic rhinitis due to pollen: Secondary | ICD-10-CM | POA: Diagnosis not present

## 2016-04-22 DIAGNOSIS — J301 Allergic rhinitis due to pollen: Secondary | ICD-10-CM | POA: Diagnosis not present

## 2016-04-24 DIAGNOSIS — J301 Allergic rhinitis due to pollen: Secondary | ICD-10-CM | POA: Diagnosis not present

## 2016-06-02 ENCOUNTER — Ambulatory Visit (INDEPENDENT_AMBULATORY_CARE_PROVIDER_SITE_OTHER): Payer: BLUE CROSS/BLUE SHIELD | Admitting: Nurse Practitioner

## 2016-06-02 ENCOUNTER — Encounter: Payer: Self-pay | Admitting: Nurse Practitioner

## 2016-06-02 ENCOUNTER — Telehealth: Payer: Self-pay | Admitting: *Deleted

## 2016-06-02 VITALS — BP 88/56 | HR 64 | Ht 63.0 in | Wt 118.0 lb

## 2016-06-02 DIAGNOSIS — R300 Dysuria: Secondary | ICD-10-CM

## 2016-06-02 DIAGNOSIS — R350 Frequency of micturition: Secondary | ICD-10-CM | POA: Diagnosis not present

## 2016-06-02 DIAGNOSIS — N760 Acute vaginitis: Secondary | ICD-10-CM | POA: Diagnosis not present

## 2016-06-02 DIAGNOSIS — R3915 Urgency of urination: Secondary | ICD-10-CM

## 2016-06-02 LAB — POCT URINALYSIS DIPSTICK
Bilirubin, UA: NEGATIVE
GLUCOSE UA: NEGATIVE
KETONES UA: NEGATIVE
Nitrite, UA: NEGATIVE
Protein, UA: NEGATIVE
RBC UA: NEGATIVE
Urobilinogen, UA: NEGATIVE
pH, UA: 6

## 2016-06-02 MED ORDER — CEPHALEXIN 500 MG PO CAPS: 500.0000 mg | ORAL_CAPSULE | Freq: Two times a day (BID) | ORAL | 0 refills | Status: DC

## 2016-06-02 NOTE — Telephone Encounter (Signed)
Spoke with Wendy Medina at [facility]. Pharmacy calling to verify instructions for Keflex 500mg  sent in today. Advised Keflex 500 mg BID for 7 days #14/0RF.    P: Reviewed findings of UTI and need for treatment. Rx:       Keflex 500 mg BID until culture comes back             Follow with Affirm and treat if needed ZOX:WRUEALab:Urine micro, culture Reviewed warning signs and symptoms of UTI and need to advise if occurring. 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  Routing to provider for final review. Will close encounter.

## 2016-06-02 NOTE — Progress Notes (Signed)
Patient ID: Wendy Medina, female   DOB: 01/26/1961, 56 y.o.   MRN: [id number]  56 y.o.Married Caucasian female G2P2002 here with complaint of UTI, with onset about 10 days ago. Patient complaining of:  dysuria, hematuria, urinary frequency and abdominal pain. Patient denies fever, chills, nausea. No new personal products. Just finished Macrobid X 7 days on Sunday.  Now with some vaginal symptoms of burning. No distress. Patient feels symptoms were aggravated by sexual activity. 3 spin classes and SA right after was about 10 days ago.   Contraception is postmenopausal.  No change in partner. Menopausal with vaginal dryness. Using Estring every 3 months.  Patient has adequate water intake.  LMP 09/28/2008.   O: Healthy female WDWN Affect: Normal, orientation x 3 Skin : warm and dry CVAT: negative bilateral Abdomen: positive for suprapubic tenderness  Pelvic exam: External genital area: red but no lesions Bladder,Urethra tender, Urethral meatus: normal Vagina: vaginal discharge that is white to clear - Affirm is taken  POCT:  small leuk's  A:  R/O UTI  R/O vaginitis  P: Reviewed findings of UTI and need for treatment. Rx:   Keflex 500 mg BID until culture comes back  Follow with Affirm and treat if needed EXB:MWUXLLab:Urine micro, culture Reviewed warning signs and symptoms of UTI and need to advise if occurring. 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   RV prn

## 2016-06-02 NOTE — Patient Instructions (Signed)
Urinary Tract Infection, Adult Introduction A urinary tract infection (UTI) is an infection of any part of the urinary tract. The urinary tract includes the:  Kidneys.  Ureters.  Bladder.  Urethra. These organs make, store, and get rid of pee (urine) in the body. Follow these instructions at home:  Take over-the-counter and prescription medicines only as told by your doctor.  If you were prescribed an antibiotic medicine, take it as told by your doctor. Do not stop taking the antibiotic even if you start to feel better.  Avoid the following drinks:  Alcohol.  Caffeine.  Tea.  Carbonated drinks.  Drink enough fluid to keep your pee clear or pale yellow.  Keep all follow-up visits as told by your doctor. This is important.  Make sure to:  Empty your bladder often and completely. Do not to hold pee for long periods of time.  Empty your bladder before and after sex.  Wipe from front to back after a bowel movement if you are female. Use each tissue one time when you wipe. Contact a doctor if:  You have back pain.  You have a fever.  You feel sick to your stomach (nauseous).  You throw up (vomit).  Your symptoms do not get better after 3 days.  Your symptoms go away and then come back. Get help right away if:  You have very bad back pain.  You have very bad lower belly (abdominal) pain.  You are throwing up and cannot keep down any medicines or water. This information is not intended to replace advice given to you by your health care provider. Make sure you discuss any questions you have with your health care provider. Document Released: 10/08/2007 Document Revised: 09/27/2015 Document Reviewed: 03/12/2015  2017 Elsevier  

## 2016-06-03 ENCOUNTER — Other Ambulatory Visit: Payer: Self-pay | Admitting: Nurse Practitioner

## 2016-06-03 LAB — URINALYSIS, MICROSCOPIC ONLY
Bacteria, UA: NONE SEEN [HPF]
Casts: NONE SEEN [LPF]
Crystals: NONE SEEN [HPF]
RBC / HPF: NONE SEEN RBC/HPF (ref <=2)
Yeast: NONE SEEN [HPF]

## 2016-06-03 LAB — WET PREP BY MOLECULAR PROBE
CANDIDA SPECIES: POSITIVE — AB
Gardnerella vaginalis: POSITIVE — AB
TRICHOMONAS VAG: NEGATIVE

## 2016-06-03 LAB — URINE CULTURE: Organism ID, Bacteria: NO GROWTH

## 2016-06-03 MED ORDER — FLUCONAZOLE 150 MG PO TABS: 150.0000 mg | ORAL_TABLET | Freq: Once | ORAL | 0 refills | Status: AC

## 2016-06-03 MED ORDER — METRONIDAZOLE 0.75 % VA GEL: 1.0000 | Freq: Every day | VAGINAL | 0 refills | Status: DC

## 2016-06-04 DIAGNOSIS — J301 Allergic rhinitis due to pollen: Secondary | ICD-10-CM | POA: Diagnosis not present

## 2016-06-04 NOTE — Progress Notes (Signed)
Encounter reviewed by Dr. Brook Amundson C. Silva.  

## 2016-06-10 ENCOUNTER — Telehealth: Payer: Self-pay | Admitting: Nurse Practitioner

## 2016-06-10 DIAGNOSIS — H11153 Pinguecula, bilateral: Secondary | ICD-10-CM | POA: Diagnosis not present

## 2016-06-10 DIAGNOSIS — H52223 Regular astigmatism, bilateral: Secondary | ICD-10-CM | POA: Diagnosis not present

## 2016-06-10 DIAGNOSIS — H2513 Age-related nuclear cataract, bilateral: Secondary | ICD-10-CM | POA: Diagnosis not present

## 2016-06-10 DIAGNOSIS — H5213 Myopia, bilateral: Secondary | ICD-10-CM | POA: Diagnosis not present

## 2016-06-10 DIAGNOSIS — H524 Presbyopia: Secondary | ICD-10-CM | POA: Diagnosis not present

## 2016-06-10 NOTE — Telephone Encounter (Signed)
Patient wants to speak with the nurse first. She states she is still not feeling well.

## 2016-06-10 NOTE — Telephone Encounter (Signed)
Spoke with patient. Patient states she was taking antibiotics for UTI prescribed at OV 06/02/16. Patient states she feels this has caused a yeast infection. Patient states she used OTC Monistat yesterday but is still experiencing burning, pain and creamy vaginal discharge. Patient states urinary symptoms have improved, no urinary complaints. Reviewed recent prescriptions for metrogel and diflucan prescribed 1/30. Patient state she knows nothing of these medications. Reviewed results as seen below per Ria CommentPatricia Grubb, NP. Patient states she was able to see labs and result note for stopping antibiotic but somehow missed the note from Ria CommentPatricia Grubb, NP as seen below. Patient logged into MyChart while on phone, advised patient to review to insure she can see note, patient able to view note. Advised patient to f/u with pharmacy for medication and reviewed ETOH precautions for metrogel. Advised patient to return call to office if symptoms do not improve. Patient verbalizes understanding and is agreeable.    Notes Recorded by Luisa DagoStephanie C Phillips, CMA on 06/03/2016 at 10:19 AM EST Results reviewed via MyChart on 06/03/16 at 8:35am. ------  Notes Recorded by Ria CommentPatricia Grubb, FNP on 06/03/2016 at 8:08 AM EST Results via my chart:  Hema, The vaginal wet prep was positive for both yeast and bacterial vaginitis. The bacterial is treated with Metrogel vaginal cream HS X 5. The yeast is treated with Diflucan 150 mg 1 tablet today and take the other at end on Metrogel to avoid another yeast infection. Medication will be sent to your pharmacy. If questions or if you prefer taking oral Flagyl for BV just let Judeth CornfieldStephanie know.   Routing to provider for final review. Patient is agreeable to disposition. Will close encounter.

## 2016-06-23 ENCOUNTER — Encounter: Payer: Self-pay | Admitting: Nurse Practitioner

## 2016-06-23 ENCOUNTER — Ambulatory Visit (INDEPENDENT_AMBULATORY_CARE_PROVIDER_SITE_OTHER): Payer: BLUE CROSS/BLUE SHIELD | Admitting: Nurse Practitioner

## 2016-06-23 ENCOUNTER — Telehealth: Payer: Self-pay | Admitting: Nurse Practitioner

## 2016-06-23 VITALS — BP 100/66 | HR 72 | Temp 98.0°F | Ht 63.0 in | Wt 119.0 lb

## 2016-06-23 DIAGNOSIS — N760 Acute vaginitis: Secondary | ICD-10-CM

## 2016-06-23 DIAGNOSIS — R102 Pelvic and perineal pain: Secondary | ICD-10-CM | POA: Diagnosis not present

## 2016-06-23 LAB — POCT URINALYSIS DIPSTICK
BILIRUBIN UA: NEGATIVE
Blood, UA: NEGATIVE
GLUCOSE UA: NEGATIVE
Ketones, UA: NEGATIVE
NITRITE UA: NEGATIVE
PH UA: 5
Protein, UA: NEGATIVE
Urobilinogen, UA: NEGATIVE

## 2016-06-23 MED ORDER — FLUCONAZOLE 150 MG PO TABS: ORAL_TABLET | ORAL | 1 refills | Status: DC

## 2016-06-23 NOTE — Progress Notes (Signed)
56 y.o. Married Caucasian female G2P2002 here with complaint of vaginal symptoms of increase in 'menstrual like' cramps, vaginal itching, vaginal soreness and discomfort.  She was treated for UTI on 06/02/16 but urine culture was negative.  At the same time the wet prep was positive for both BV and yeast and was treated with Metrogel and Diflucan.  She did get better X 1 day. Onset of symptoms 2 weeks ago. Denies new personal products or vaginal dryness. Has not been biking for 3 weeks as she thought this may have caused the original symptoms.  No STD concerns. Urinary symptoms none.  Now redness in the vaginal area.  Last sexual attempt was last Wednesday.  Denies fever/ chills, changes in bowel habits.  Normal appetite, no N/V.    O:  Healthy female WDWN Affect: normal, orientation x 3  Exam: no distress Abdomen: soft and non tender, normal BS, no rebounding, no mass, no flank pain. Lymph node: no enlargement or tenderness Pelvic exam: External genital: normal female BUS: negative Vagina: yellow thick cottage cheesy discharge noted.  Affirm taken.  The vaginal Estring is removed. Cervix: normal, non tender, no CMT Uterus: normal, non tender Adnexa:normal, non tender, no masses or fullness noted    A: R/O Vaginitis - most consistent with yeast both internal and external involvement.  Estring for vaginal atrophy - removed and will not replace with a new one until symptoms are all gone.   P: Discussed findings of vaginitis and etiology. Discussed Aveeno or baking soda sitz bath for comfort. Avoid moist clothes or pads for extended period of time. If working out in gym clothes or swim suits for long periods of time change underwear or bottoms of swimsuit if possible. Olive Oil/Coconut Oil use for skin protection prior to activity can be used to external skin.  Rx: Diflucan 150 mg today and repeat tomorrow  Follow with Affirm  Will insert new Estring in ~ 10 days  To call back if any further  cramps or pelvic pain.  RV prn

## 2016-06-23 NOTE — Telephone Encounter (Signed)
Patient feels like she is having menstrual cramps all the time along with vaginal itching and burning.  Wants to speak with a nurse.

## 2016-06-23 NOTE — Telephone Encounter (Signed)
Left message to call Balbina Depace at [phone].  

## 2016-06-23 NOTE — Telephone Encounter (Signed)
I am concerned about the "menstrual like" cramps.  We should see her and do pelvic and can do another vaginal wet prep.

## 2016-06-23 NOTE — Telephone Encounter (Addendum)
Returning a call to Wendy Medina . °

## 2016-06-23 NOTE — Telephone Encounter (Signed)
Spoke with patient. Patient states she was treated for BV and yeast on 06/02/16. Patient states she had one day after completing metrogel and diflucan that she felt better and no symptoms. Patient states for the last 2 weeks she has been experiencing increase in menstrual "like" cramps, vaginal itching, vaginal soreness and discomfort. Patient denies urinary complaints, vaginal discharge or odor. Advised patient would review with Ria CommentPatricia Grubb, NP and return call with recommendations, patient is agreeable.  Ria CommentPatricia Grubb, NP -please advise?

## 2016-06-23 NOTE — Progress Notes (Signed)
Encounter reviewed by Dr. Corian Handley Amundson C. Silva.  

## 2016-06-23 NOTE — Telephone Encounter (Signed)
Spoke with patient, advised as seen below per Ria Comment, NP. Offered patient appt 06/24/16 at 10:15am, patient declined. Patient states she has been "dealing with this all weekend, can I not be seen now?". Advised patient time is now 3:45pm, if you can be here for 4pm appointment time, can schedule. Patient states she will be here for 4pm appointment. Patient verbalizes understanding and is agreeable.  Routing to provider for final review. Patient is agreeable to disposition. Will close encounter.

## 2016-06-23 NOTE — Patient Instructions (Signed)

## 2016-06-24 DIAGNOSIS — N760 Acute vaginitis: Secondary | ICD-10-CM | POA: Diagnosis not present

## 2016-06-25 LAB — WET PREP BY MOLECULAR PROBE
Candida species: NEGATIVE
GARDNERELLA VAGINALIS: POSITIVE — AB
TRICHOMONAS VAG: NEGATIVE

## 2016-06-25 MED ORDER — METRONIDAZOLE 500 MG PO TABS: 500.0000 mg | ORAL_TABLET | Freq: Two times a day (BID) | ORAL | 0 refills | Status: DC

## 2016-06-25 MED ORDER — FLUCONAZOLE 150 MG PO TABS: ORAL_TABLET | ORAL | 1 refills | Status: DC

## 2016-06-27 ENCOUNTER — Other Ambulatory Visit: Payer: Self-pay | Admitting: Nurse Practitioner

## 2016-06-27 DIAGNOSIS — Z1231 Encounter for screening mammogram for malignant neoplasm of breast: Secondary | ICD-10-CM

## 2016-06-30 ENCOUNTER — Telehealth: Payer: Self-pay | Admitting: Nurse Practitioner

## 2016-06-30 MED ORDER — METRONIDAZOLE 0.75 % VA GEL: VAGINAL | 0 refills | Status: DC

## 2016-06-30 NOTE — Telephone Encounter (Signed)
Patient wants to speak with the nurse about a recurring issue she is having.

## 2016-06-30 NOTE — Telephone Encounter (Signed)
Left message to call Wendy Medina at [phone].  

## 2016-06-30 NOTE — Telephone Encounter (Signed)
Spoke with patient. Patient states she did not take the full course of Flagyl, stopped on 2/23 d/t the side effects. Patient reports abdominal pain that resolved after stopping medication. Patient states she does not believe this type antibiotic is working for her and request alternative. Patient states she is still experiencing vaginal soreness and redness. Patient denies vaginal discharge, itching or odor. Advised patient would review with Ria CommentPatricia Grubb, NP and return call with recommendations, patient is agreeable.  Ria CommentPatricia Grubb, NP -please advise?

## 2016-06-30 NOTE — Telephone Encounter (Signed)
Spoke with patient, advised as seen below per Ria CommentPatricia Grubb, NP. Patient asking if Estring can be replaced? Advised patient will review with Ria CommentPatricia Grubb, NP And return call, patient verbalizes understanding and is agreeable.  Reviewed with Ria CommentPatricia Grubb, NP -will leave estring out until symptoms have resolved. Clarified diflucan instructions. Take one tablet after completion of metrogel and second tablet 72 hrs later.  Return call to patient, advised as seen above per Ria CommentPatricia Grubb, NP. Order placed for metrogel, advised patient has refill on diflucan ordered 06/25/16. Patient states she never took first dose of diflucan. Reviewed instructions again, advised to return call if symptoms do not resolve. Patient is agreeable.  Routing to provider for final review. Patient is agreeable to disposition. Will close encounter.

## 2016-06-30 NOTE — Telephone Encounter (Signed)
Returning a call to MadisonJill h.

## 2016-06-30 NOTE — Telephone Encounter (Signed)
My advise since she did not tolerate Flagyl is to change to Metrogel HS X 5.  She still needs to take the 2 diflucan at end of treatment to prevent yeast infection again.

## 2016-07-09 ENCOUNTER — Telehealth: Payer: Self-pay | Admitting: *Deleted

## 2016-07-09 DIAGNOSIS — J301 Allergic rhinitis due to pollen: Secondary | ICD-10-CM | POA: Diagnosis not present

## 2016-07-09 NOTE — Telephone Encounter (Signed)
PA for Estring 2mg  denied.   Leota Sauerseborah Leonard, CNM -please advise on alternative?  Cc: Zenovia Jordanaylor Mitchell

## 2016-07-09 NOTE — Telephone Encounter (Signed)
She can ask for an appeal or change medication source

## 2016-07-09 NOTE — Telephone Encounter (Signed)
Patient is calling regarding her prior authorization denial. Patient was told by her insurance company that she will ned to try a different medication. Patient is not sure what to do next?

## 2016-07-10 ENCOUNTER — Other Ambulatory Visit: Payer: Self-pay | Admitting: Certified Nurse Midwife

## 2016-07-10 DIAGNOSIS — N952 Postmenopausal atrophic vaginitis: Secondary | ICD-10-CM

## 2016-07-10 MED ORDER — ESTRADIOL 0.1 MG/GM VA CREA: TOPICAL_CREAM | VAGINAL | 12 refills | Status: DC

## 2016-07-10 NOTE — Telephone Encounter (Signed)
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 We may need to Estrace cream. No order placed

## 2016-07-10 NOTE — Telephone Encounter (Signed)
Reviewed dosing and application with Leota Sauerseborah Leonard, CNM. 1/2 pv 2 times per week at bedtime. Patient to call in one month with update. Patient to call if symptoms don't resolve or having problems with use.  Call to patient, advised as seen above per Leota Sauerseborah Leonard, CNM. Patient verbalizes understanding and is agreeable. Rx to verified pharmacy on file.  Routing to provider for final review. Patient is agreeable to disposition. Will close encounter.

## 2016-07-10 NOTE — Telephone Encounter (Signed)
Spoke with patient. Advised of message as seen below from PepsiCoDeborah Leonard CNM. Patient would like to try an alternative. Asking if Vagifem would be a good alternative for her. Patient is going out of town tomorrow and requests alternative be sent in today. Advised will review with Leota Sauerseborah Leonard CNM and return call. Patient is agreeable.

## 2016-07-10 NOTE — Telephone Encounter (Signed)
I think Vagifem trial is appropriate. She will  Need to insert vaginal tablet with applicator twice weekly. She needs to advise after a month of use or if having problems with use before . Order placed

## 2016-07-21 ENCOUNTER — Telehealth: Payer: Self-pay | Admitting: Nurse Practitioner

## 2016-07-21 ENCOUNTER — Ambulatory Visit
Admission: RE | Admit: 2016-07-21 | Discharge: 2016-07-21 | Disposition: A | Discharge status: Home / Self Care | Payer: BLUE CROSS/BLUE SHIELD | Source: Ambulatory Visit | Attending: Nurse Practitioner | Admitting: Nurse Practitioner

## 2016-07-21 DIAGNOSIS — Z1231 Encounter for screening mammogram for malignant neoplasm of breast: Secondary | ICD-10-CM | POA: Diagnosis not present

## 2016-07-21 MED ORDER — FLUCONAZOLE 150 MG PO TABS: ORAL_TABLET | ORAL | 1 refills | Status: DC

## 2016-07-21 MED ORDER — CEPHALEXIN 500 MG PO CAPS: 500.0000 mg | ORAL_CAPSULE | Freq: Two times a day (BID) | ORAL | 0 refills | Status: DC

## 2016-07-21 NOTE — Telephone Encounter (Signed)
Pt is given a phone call about UTI symptoms.  Per DPR OK to leave a message.  In January she was given Macrobid and did not seem to do well with that and later changed to Keflex.  She is given another refill on Keflex and Macrobid as backup if needed.  Once pt hears the message and if not OK she will not go and get Rx and call back in am. She is prone to UTI as she is runner.

## 2016-07-21 NOTE — Telephone Encounter (Signed)
See PC note 

## 2016-07-21 NOTE — Telephone Encounter (Signed)
Spoke with patient. Patient states she woke up with "UTI" on 3/17. Patient reports she was out of town and was experiencing pain with urination, blood in urine and urinary frequency. Patient states she took OTC Azo while out of town and when she return home on 3/18 she started taking 1/2 of Keflex prescription that was previously prescribed for UTI. Patient states she has taken 3 doses and has started to feel better. Patient states no blood in urine, no lower back pain, no fever or nausea. Patient states she still has some urinary frequency and vaginal irritation, no odor or discharge. Patient reports she started estrace vaginal cream and has not noticed any difference after 3 doses, can this be the cause of UTI? Patient requesting something be called in for UTI. Advised patient would review with Wendy CommentPatricia Grubb, NP and return call with recommendations, patient is agreeable.  Wendy CommentPatricia Grubb, NP -please advise?

## 2016-07-21 NOTE — Telephone Encounter (Signed)
Patient thinks she has another UTI and wants something called into her pharmacy

## 2016-07-22 ENCOUNTER — Ambulatory Visit: Payer: BLUE CROSS/BLUE SHIELD | Admitting: Nurse Practitioner

## 2016-07-22 ENCOUNTER — Encounter: Payer: Self-pay | Admitting: Nurse Practitioner

## 2016-07-22 VITALS — BP 90/64 | HR 72 | Ht 63.0 in | Wt 121.0 lb

## 2016-07-22 DIAGNOSIS — R350 Frequency of micturition: Secondary | ICD-10-CM | POA: Diagnosis not present

## 2016-07-22 DIAGNOSIS — R319 Hematuria, unspecified: Secondary | ICD-10-CM | POA: Diagnosis not present

## 2016-07-22 DIAGNOSIS — R300 Dysuria: Secondary | ICD-10-CM

## 2016-07-22 DIAGNOSIS — N390 Urinary tract infection, site not specified: Secondary | ICD-10-CM

## 2016-07-22 DIAGNOSIS — N760 Acute vaginitis: Secondary | ICD-10-CM

## 2016-07-22 LAB — POCT URINALYSIS DIPSTICK
Bilirubin, UA: NEGATIVE
Glucose, UA: NEGATIVE
Ketones, UA: NEGATIVE
NITRITE UA: NEGATIVE
PH UA: 6 (ref 5.0–8.0)
PROTEIN UA: NEGATIVE
UROBILINOGEN UA: NEGATIVE (ref ?–2.0)

## 2016-07-22 NOTE — Progress Notes (Signed)
Patient ID: [name], female   DOB: 12/31/1960, 56 y.o.   MRN: [id number]   56 y.o. Married Caucasian female G2P2002 here with complaint of vaginal symptoms of itching, burning, and increase discharge. Describes discharge as thick white to yellow.  She got symptoms again after SA.  Husband is age 57 and she is concerned that he may be giving her back infections.  She did have frank hematuria on Saturday am while out of town.  They did do a marathon run and hiking - she has been very careful about cleaning and changing wet clothes after her exercise routine.  With the burning of the vulvar area feels that she has BV again.  She has Estrace vaginal cream 1/2 gm twice a week with no help.  She felt like she did better with Estring. No STD concerns. Urinary symptoms of urgency, frequency, hematuria, and dysuria. Contraception is postmenopausal.   O:  Healthy female WDWN Affect: normal, orientation x 3  Exam: no distress but frustrated Abdomen: soft and non tender, no flank pain Lymph node: no enlargement or tenderness Pelvic exam: External genital: normal female with areas at the introitus that is very tender - there is an abnormal area on the left side that is oval and swollen that is very tender.  No specific lesion that is classic for HSV, HPV, LSA etc BUS: negative Vagina: had to use a small spec due to pain.   Discharge noted is thin and yellow tint.  Affirm taken.       A: Vaginitis - recurrent  R/O UTI  Vaginal wall pain - doubt vaginismus but not responding well to Estrace vaginal cream for atrophy.  No relief of dryness. - wants to retry Estring - will need to get PA form done.  Vulvar lesion - ? LSA, doubt HSV/ HPV (pap 10/04/14 normal with negative HR HPV)   P: Discussed findings of vaginitis and etiology. Discussed Aveeno or baking soda sitz bath for comfort. Avoid moist clothes or pads for extended period of time. If working out in gym clothes or swim suits for long periods of  time change underwear or bottoms of swimsuit if possible. Olive Oil/Coconut Oil use for skin protection prior to activity can be used to external skin.  Rx: she has some Metrogel on hand and will use pending results  Will send urine for C&S  Follow with Affirm  With reference to the abnormal area - would like to see her back and re examine when she has no symptoms to see if area is cleared or if need further eval    RV prn

## 2016-07-22 NOTE — Telephone Encounter (Signed)
Spoke with patient. Patient states she scheduled an appointment for today at 3:45pm. Patient states UTI is clearing but now feels like BV never completley went away. Would like to discuss recurrent BV and UTI with Ria CommentPatricia Grubb, NP. Advised patient to keep scheduled OV for further discussion, patient is agreeable.   Routing to provider for final review. Patient is agreeable to disposition. Will close encounter.

## 2016-07-22 NOTE — Telephone Encounter (Signed)
Left message to call Franky Reier at [phone].  

## 2016-07-22 NOTE — Patient Instructions (Signed)
Lichen Sclerosus Lichen sclerosus is a skin problem. It can happen on any part of the body. It happens most often in the anal or genital areas. It can cause itching and discomfort. Treatment can help to control symptoms. This skin problem is not passed from one person to another (not contagious). The cause is not known. Follow these instructions at home:  Take over-the-counter and prescription medicines only as told by your doctor.  Use creams or ointments as told by your doctor.  Do not scratch the affected areas of skin.  Women should keep the vagina as clean and dry as they can.  Keep all follow-up visits as told by your doctor. This is important. Contact a doctor if:  Your redness, swelling, or pain gets worse.  You have fluid, blood, or pus coming from the area.  You have new patches (lesions) on your skin.  You have a fever.  You have pain during sex. This information is not intended to replace advice given to you by your health care provider. Make sure you discuss any questions you have with your health care provider. Document Released: 04/03/2008 Document Revised: 09/27/2015 Document Reviewed: 07/17/2014 Elsevier Interactive Patient Education  2017 Elsevier Inc.  

## 2016-07-23 LAB — WET PREP BY MOLECULAR PROBE
Candida species: NOT DETECTED
GARDNERELLA VAGINALIS: NOT DETECTED
TRICHOMONAS VAG: NOT DETECTED

## 2016-07-24 LAB — URINE CULTURE: Organism ID, Bacteria: NO GROWTH

## 2016-07-24 NOTE — Progress Notes (Signed)
Encounter reviewed by Dr. Janean SarkBrook Amundson C. Silva. Will check urine micro for hematuria. Has follow up scheduled for June 2018.

## 2016-07-29 NOTE — Addendum Note (Signed)
Addended by: Luisa DagoPHILLIPS, STEPHANIE C on: 07/29/2016 04:19 PM   Modules accepted: Orders

## 2016-07-31 ENCOUNTER — Telehealth: Payer: Self-pay

## 2016-07-31 MED ORDER — ESTRADIOL 2 MG VA RING: 2.0000 mg | VAGINAL_RING | VAGINAL | 0 refills | Status: DC

## 2016-07-31 NOTE — Telephone Encounter (Signed)
-----   Message from Ria CommentPatricia Grubb, FNP sent at 07/22/2016  5:43 PM EDT ----- Pt is asking that we submit another PA form for Estring - she is not getting good / any relief with Estrace.

## 2016-07-31 NOTE — Telephone Encounter (Signed)
Order for Estring 2 mg vaginal ring place 1 ring vaginally every 3 months sent to pharmacy on file. PA submitted to patient's insurance via cover my meds.

## 2016-08-04 ENCOUNTER — Telehealth: Payer: Self-pay | Admitting: Nurse Practitioner

## 2016-08-04 NOTE — Telephone Encounter (Signed)
Message left to return call to Triage RN at [phone].    

## 2016-08-04 NOTE — Telephone Encounter (Signed)
Patient returned call. Patient states that she has recurrent BV and that she felt that her symptoms were returning. States, "I had weird discharge and cramping." She states she used some metrogel that she had and is beginning to feel better, but now she feels as if she may have a yeast infection. RN offered office visit, and patient states "I think that is a waste of time, I just don't know what to do at this point." Asking if she can just have prescription for yeast medication. RN advised patient office visits are offered in order to make sure we are treating correctly. Patient agreeable, states she will make appointment, but if medicine can just be called in that would be better as she is going out of town on Thursday. Office visit scheduled for Wednesday 08/06/16 at 1345. Patient agreeable to date and time of appointment.   Routing to provider for review.   Cc Ria Comment, FNP

## 2016-08-04 NOTE — Telephone Encounter (Signed)
Patient states she is having an ongoing problem with a bacterial infection. States no new symptoms, but ongoing and would like to speak with a nurse.  Agreeable to return call from triage.

## 2016-08-06 ENCOUNTER — Ambulatory Visit (INDEPENDENT_AMBULATORY_CARE_PROVIDER_SITE_OTHER): Payer: BLUE CROSS/BLUE SHIELD | Admitting: Certified Nurse Midwife

## 2016-08-06 ENCOUNTER — Encounter: Payer: Self-pay | Admitting: Certified Nurse Midwife

## 2016-08-06 VITALS — BP 102/64 | HR 64 | Resp 16 | Ht 63.0 in | Wt 121.0 lb

## 2016-08-06 DIAGNOSIS — N760 Acute vaginitis: Secondary | ICD-10-CM

## 2016-08-06 DIAGNOSIS — R599 Enlarged lymph nodes, unspecified: Secondary | ICD-10-CM

## 2016-08-06 DIAGNOSIS — N952 Postmenopausal atrophic vaginitis: Secondary | ICD-10-CM | POA: Diagnosis not present

## 2016-08-06 LAB — HEPATIC FUNCTION PANEL
ALK PHOS: 63 U/L (ref 33–130)
ALT: 14 U/L (ref 6–29)
AST: 26 U/L (ref 10–35)
Albumin: 4.3 g/dL (ref 3.6–5.1)
BILIRUBIN INDIRECT: 0.4 mg/dL (ref 0.2–1.2)
Bilirubin, Direct: 0.1 mg/dL (ref <=0.2)
Total Bilirubin: 0.5 mg/dL (ref 0.2–1.2)
Total Protein: 6.9 g/dL (ref 6.1–8.1)

## 2016-08-06 LAB — CBC WITH DIFFERENTIAL/PLATELET
BASOS ABS: 0 {cells}/uL (ref 0–200)
BASOS PCT: 0 %
EOS ABS: 51 {cells}/uL (ref 15–500)
Eosinophils Relative: 1 %
HEMATOCRIT: 37.3 % (ref 35.0–45.0)
Hemoglobin: 12.1 g/dL (ref 11.7–15.5)
LYMPHS PCT: 34 %
Lymphs Abs: 1734 cells/uL (ref 850–3900)
MCH: 30 pg (ref 27.0–33.0)
MCHC: 32.4 g/dL (ref 32.0–36.0)
MCV: 92.3 fL (ref 80.0–100.0)
MONO ABS: 357 {cells}/uL (ref 200–950)
MONOS PCT: 7 %
MPV: 12 fL (ref 7.5–12.5)
Neutro Abs: 2958 cells/uL (ref 1500–7800)
Neutrophils Relative %: 58 %
Platelets: 183 10*3/uL (ref 140–400)
RBC: 4.04 MIL/uL (ref 3.80–5.10)
RDW: 14.2 % (ref 11.0–15.0)
WBC: 5.1 10*3/uL (ref 3.8–10.8)

## 2016-08-06 NOTE — Progress Notes (Signed)
56 y.o. Married Caucasian female G2P2002 here with complaint of vaginal symptoms of  burning, with slight cramping and increase discharge. Describes discharge as chunky with odor. 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 Has also taken Diflucan at least 3 times ( 2 pills). "I feel this started with having to stop using Estring for vaginal dryness, due to insurance coverage. Tried vaginal cream no success". No UTI symptoms. Will be traveling again and need this resolved. Patient has not taken probiotics to see if change. Feels run down with all the medication use. No new personal products, uses [employer]. Has not tried Aveeno sitz bath for comfort. No other health issues today.   .ROS Pertinent to above  O:Healthy female thin Affect: normal, orientation x 3  Exam: Skin: warm and dry Abdomen:soft, non tender  Inguinal Lymph nodes: slightly enlarged, non tender Pelvic exam: External genital: normal female, redness inside vulva area and vulva is swollen, no lesions BUS: negative Vagina: copious non odorous discharge noted. Ph: 4.5   ,Wet prep taken, Affirm taken Cervix: normal, non tender, no CMT Uterus: normal, non tender Adnexa:normal, non tender, no masses or fullness noted   Wet Prep results: unable to read with Metrogel present   A:Normal pelvic exam Poly medicine use ? Chronic vulvitis/vaginitis Atrophic vaginitis/vaginal dryness Slightly enlarged inguinal lymph nodes  P:Discussed findings of atrophic vaginitis/vulvovagintis and etiology. Discussed concerns with multi use of Metrogel and Flagyl and no change vaginal. Also discussed concerns with effect on liver.. Discussed Aveeno sitz bath for comfort and instructions for use. Recommend starting today. Avoid any other treatment at this point other than comfort. Avoid moist clothes or pads for extended period of time. Questions addressed. Discussed oral probiotic use to help with immune  status to stabilize vaginal flora. Recommended Floragen and information given. Will await affirm for any treatment at this point. Patient agreeable. If treatment needed would suggest Hylafem due to moisturizer with medication. Discussed enlarged lymph nodes finding and need to check CBC with diff. ?etiology discussed. Lab: CBC with diff, LFT's   Rv prn

## 2016-08-07 ENCOUNTER — Other Ambulatory Visit: Payer: Self-pay | Admitting: Certified Nurse Midwife

## 2016-08-07 LAB — WET PREP BY MOLECULAR PROBE
Candida species: NOT DETECTED
GARDNERELLA VAGINALIS: NOT DETECTED
Trichomonas vaginosis: NOT DETECTED

## 2016-08-07 MED ORDER — ESTRADIOL 2 MG VA RING: 2.0000 mg | VAGINAL_RING | VAGINAL | 1 refills | Status: DC

## 2016-08-07 NOTE — Telephone Encounter (Signed)
Patient seen in office on 08/06/16 for evaluation, will close encounter.  Routing to provider for final review. Patient is agreeable to disposition. Will close encounter.

## 2016-08-08 ENCOUNTER — Telehealth: Payer: Self-pay

## 2016-08-08 NOTE — Telephone Encounter (Signed)
Patient returned call

## 2016-08-08 NOTE — Telephone Encounter (Signed)
-----   Message from Verner Chol, CNM sent at 08/08/2016 10:07 AM EDT ----- Notify patient that her wet prep was negative for yeast, BV and trichomonas.  Feel irritation and symptoms are coming from vaginal dryness. Renewed E-string Rx. Also feel overuse of prescription medication without confirming if actual infection present can be harmful. Recommend starting Oral probiotic as discussed, using Aveeno sitz when discomfort begins and see if resolves in next 24 hours and if not come in for OV.  Liver profile is normal CBC with diff is normal Request patient come for recheck of lymph nodes due to enlargement one month

## 2016-08-08 NOTE — Telephone Encounter (Signed)
Patient notified of results as written by provider. See lab 

## 2016-08-08 NOTE — Telephone Encounter (Signed)
lmtcb

## 2016-08-11 NOTE — Progress Notes (Signed)
Encounter reviewed Jill Jertson, MD   

## 2016-08-15 NOTE — Telephone Encounter (Signed)
Spoke with patient. Advised of denial of PA for coverage of Estring. Per insurance company patient will need to try Vagifem or Premarin cream. 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 Does not want to use Premarin. "I do not like using the creams. They are just not conducive to my life style and do not work." Patient would like to use online coupon for [employer] and will also look at [employer] [facility] to see if this will help to reduce her cost. Will call back with any questions or concerns.  Routing to provider for final review. Patient agreeable to disposition. Will close encounter.

## 2016-08-18 DIAGNOSIS — J301 Allergic rhinitis due to pollen: Secondary | ICD-10-CM | POA: Diagnosis not present

## 2016-08-28 ENCOUNTER — Ambulatory Visit (INDEPENDENT_AMBULATORY_CARE_PROVIDER_SITE_OTHER): Payer: BLUE CROSS/BLUE SHIELD | Admitting: Certified Nurse Midwife

## 2016-08-28 ENCOUNTER — Encounter: Payer: Self-pay | Admitting: Certified Nurse Midwife

## 2016-08-28 VITALS — BP 90/60 | HR 64 | Resp 16 | Ht 63.0 in | Wt 118.0 lb

## 2016-08-28 DIAGNOSIS — N952 Postmenopausal atrophic vaginitis: Secondary | ICD-10-CM | POA: Diagnosis not present

## 2016-08-28 DIAGNOSIS — N898 Other specified noninflammatory disorders of vagina: Secondary | ICD-10-CM

## 2016-08-28 NOTE — Patient Instructions (Signed)
Atrophic Vaginitis Atrophic vaginitis is when the tissues that line the vagina become dry and thin. This is caused by a drop in estrogen. Estrogen helps:  To keep the vagina moist.  To make a clear fluid that helps:  To lubricate the vagina for sex.  To protect the vagina from infection. If the lining of the vagina is dry and thin, it may:  Make sex painful. It may also cause bleeding.  Cause a feeling of:  Burning.  Irritation.  Itchiness.  Make an exam of your vagina painful. It may also cause bleeding.  Make you lose interest in sex.  Cause a burning feeling when you pee.  Make your vaginal fluid (discharge) brown or yellow. For some women, there are no symptoms. This condition is most common in women who do not get their regular menstrual periods anymore (menopause). This often starts when a woman is 45-55 years old. Follow these instructions at home:  Take medicines only as told by your doctor. Do not use any herbal or alternative medicines unless your doctor says it is okay.  Use over-the-counter products for dryness only as told by your doctor. These include:  Creams.  Lubricants.  Moisturizers.  Do not douche.  Do not use products that can make your vagina dry. These include:  Scented feminine sprays.  Scented tampons.  Scented soaps.  If it hurts to have sex, tell your sexual partner. Contact a doctor if:  Your discharge looks different than normal.  Your vagina has an unusual smell.  You have new symptoms.  Your symptoms do not get better with treatment.  Your symptoms get worse. This information is not intended to replace advice given to you by your health care provider. Make sure you discuss any questions you have with your health care provider. Document Released: 10/08/2007 Document Revised: 09/27/2015 Document Reviewed: 04/12/2014 Elsevier Interactive Patient Education  2017 Elsevier Inc.  

## 2016-08-28 NOTE — Progress Notes (Signed)
56 y.o. Married Caucasian female G2P2002 here with complaint of vaginal symptoms of burning externally only. Using Estring again for the past 2 weeks for atrophic vaginitis. Has not had sexually activity yet for fear of pain from dryness. Worried she may have another infection, but did not use any medication per last discussion to avoid poly medicine use. Has started training with bike again for out of country biking excursion and worried she will have vagina symptoms again. Has not noted any redness or scaling on vulva, mainly at entrance to vagina feels dry. Did not try coconut oil because seemed better after stopping medications. Onset of symptoms 5 days ago. Denies new personal products.  No STD concerns. Urinary symptoms none . Contraception is Menopausal  ROS Pertinent to above  O:Healthy female WDWN Affect: normal, orientation x 3  Exam: Skin: warm and dry Abdomen:soft, non tender Lymph node: no enlargement or tenderness Pelvic exam: External genital: normal female with no scaling or redness or exudate BUS: negative Vagina: moisture noted, no odor, non tender, estring noted.  Introital area slight increase pink with slight tenderness   Affirm taken Cervix: normal, non tender, no CMT Uterus: normal, non tender Adnexa:normal, non tender, no masses or fullness noted   A:Normal pelvic exam Atrophic vaginitis with dryness noted at introital area R/O vaginal pathogens   P:Discussed findings of vaginal dryness at introital area and etiology. Discussed Coconut oil to area for protection with bike training and for dryness. Instructions given for nightly and prn use. Discussed having extra underwear to change into to decrease moisture exposure on long bike rides and coconut oil for protection. Questions addressed at length. Continue probiotic oral use and estring as discussed. Will treat per affirm if indicated. Suggested patient come in before trip if any concerns and be assessed.Marland Kitchen   Rv  prn

## 2016-08-28 NOTE — Progress Notes (Signed)
Encounter reviewed Dayanira Giovannetti, MD   

## 2016-08-29 LAB — WET PREP BY MOLECULAR PROBE
Candida species: NOT DETECTED
GARDNERELLA VAGINALIS: NOT DETECTED
TRICHOMONAS VAG: NOT DETECTED

## 2016-09-02 ENCOUNTER — Telehealth: Payer: Self-pay | Admitting: Certified Nurse Midwife

## 2016-09-02 DIAGNOSIS — Z91018 Allergy to other foods: Secondary | ICD-10-CM | POA: Diagnosis not present

## 2016-09-02 DIAGNOSIS — J301 Allergic rhinitis due to pollen: Secondary | ICD-10-CM | POA: Diagnosis not present

## 2016-09-02 DIAGNOSIS — K219 Gastro-esophageal reflux disease without esophagitis: Secondary | ICD-10-CM | POA: Diagnosis not present

## 2016-09-02 NOTE — Telephone Encounter (Signed)
Patient called and cancelled her appointment for "recheck lymph nodes." She said, "I just saw Debbi again and she didn't mention this appointment. I think I'm fine."  Routing to provider for review.

## 2016-09-03 NOTE — Telephone Encounter (Signed)
I did see her and all had resolved. Noted in note.

## 2016-09-05 ENCOUNTER — Ambulatory Visit: Payer: BLUE CROSS/BLUE SHIELD | Admitting: Certified Nurse Midwife

## 2016-09-22 DIAGNOSIS — J301 Allergic rhinitis due to pollen: Secondary | ICD-10-CM | POA: Diagnosis not present

## 2016-10-29 ENCOUNTER — Ambulatory Visit: Payer: BLUE CROSS/BLUE SHIELD | Admitting: Nurse Practitioner

## 2016-11-04 DIAGNOSIS — J301 Allergic rhinitis due to pollen: Secondary | ICD-10-CM | POA: Diagnosis not present

## 2016-11-13 ENCOUNTER — Ambulatory Visit (INDEPENDENT_AMBULATORY_CARE_PROVIDER_SITE_OTHER): Payer: BLUE CROSS/BLUE SHIELD | Admitting: Certified Nurse Midwife

## 2016-11-13 ENCOUNTER — Encounter: Payer: Self-pay | Admitting: Certified Nurse Midwife

## 2016-11-13 VITALS — BP 116/60 | HR 70 | Resp 16 | Ht 62.75 in | Wt 118.0 lb

## 2016-11-13 DIAGNOSIS — N952 Postmenopausal atrophic vaginitis: Secondary | ICD-10-CM | POA: Diagnosis not present

## 2016-11-13 DIAGNOSIS — Z01419 Encounter for gynecological examination (general) (routine) without abnormal findings: Secondary | ICD-10-CM

## 2016-11-13 DIAGNOSIS — N951 Menopausal and female climacteric states: Secondary | ICD-10-CM

## 2016-11-13 MED ORDER — ESTRADIOL 2 MG VA RING: 2.0000 mg | VAGINAL_RING | VAGINAL | 4 refills | Status: DC

## 2016-11-13 NOTE — Progress Notes (Signed)
56 y.o. 822P2002 Married  Caucasian Fe here for annual exam. Denies vaginal bleeding or vaginal dryness now. Using coconut oil frequently with no issues. Sees PCP for Aex and labs. Estring working well for vaginal dryness also. Please remove today and she will insert new one at home. No health issues today. Recent bike trip to [place]  Patient's last menstrual period was 09/28/2008 (exact date).          Sexually active: Yes.    The current method of family planning is post menopausal status.    Exercising: Yes.    bike, run, pilates, yoga, tennis & weights Smoker:  no  Health Maintenance: Pap:  10-04-14 neg HPV HR neg History of Abnormal Pap: no MMG:  07-21-16 category b density birads 1:neg Self Breast exams: no Colonoscopy:  2012 normal f/u 861yrs BMD:   none TDaP:  2016 Shingles: no Pneumonia: no Hep C and HIV: had done when married Labs: none   reports that she has never smoked. She has never used smokeless tobacco. She reports that she drinks about 0.6 oz of alcohol per week . She reports that she does not use drugs.  Past Medical History:  Diagnosis Date  . Anemia   . Anxiety    with air travel   . Dysmenorrhea   . H/O renal calculi   . Mastalgia   . Ovarian cyst     Past Surgical History:  Procedure Laterality Date  . COLONOSCOPY  10/25/10   negative recheckin 10 years  . WISDOM TOOTH EXTRACTION      Current Outpatient Prescriptions  Medication Sig Dispense Refill  . estradiol (ESTRING) 2 MG vaginal ring Place 2 mg vaginally every 3 (three) months. Insert a new ring into vagina every 3 months 1 each 1  . Probiotic Product (PROBIOTIC PO) Take by mouth.    Marland Kitchen. UNABLE TO FIND Allergy Injections once a month     No current facility-administered medications for this visit.     Family History  Problem Relation Age of Onset  . Hypertension Mother   . Thyroid disease Mother   . Heart failure Maternal Grandmother   . Stroke Paternal Grandmother   . Heart failure  Paternal Uncle     ROS:  Pertinent items are noted in HPI.  Otherwise, a comprehensive ROS was negative.  Exam:   BP 116/60   Pulse 70   Resp 16   Ht 5' 2.75" (1.594 m)   Wt 118 lb (53.5 kg)   LMP 09/28/2008 (Exact Date)   BMI 21.07 kg/m  Height: 5' 2.75" (159.4 cm) Ht Readings from Last 3 Encounters:  11/13/16 5' 2.75" (1.594 m)  08/28/16 5\' 3"  (1.6 m)  08/06/16 5\' 3"  (1.6 m)    General appearance: alert, cooperative and appears stated age Head: Normocephalic, without obvious abnormality, atraumatic Neck: no adenopathy, supple, symmetrical, trachea midline and thyroid normal to inspection and palpation Lungs: clear to auscultation bilaterally Breasts: normal appearance, no masses or tenderness, No nipple retraction or dimpling, No nipple discharge or bleeding, No axillary or supraclavicular adenopathy Heart: regular rate and rhythm Abdomen: soft, non-tender; no masses,  no organomegaly Extremities: extremities normal, atraumatic, no cyanosis or edema Skin: Skin color, texture, turgor normal. No rashes or lesions Lymph nodes: Cervical, supraclavicular, and axillary nodes normal. No abnormal inguinal nodes palpated Neurologic: Grossly normal   Pelvic: External genitalia:  no lesions              Urethra:  normal appearing urethra  with no masses, tenderness or lesions              Bartholin's and Skene's: normal                 Vagina: normal appearing vagina with normal color and discharge, no lesions              Cervix: no bleeding following Pap, no cervical motion tenderness and no lesions              Pap taken: No. Bimanual Exam:  Uterus:  normal size, contour, position, consistency, mobility, non-tender              Adnexa: normal adnexa and no mass, fullness, tenderness               Rectovaginal: Confirms               Anus:  normal sphincter tone, no lesions  Chaperone present: yes  A:  Well Woman with normal exam  Atrophic vaginitis with Estring use desires  continuance  Menopausal no HRT  P:   Reviewed health and wellness pertinent to exam  Discussed risks and benefits, request Rx.  Rx Estring see order with instructions  Aware to advise if vaginal bleeding  Pap smear: no   counseled on breast self exam, mammography screening, adequate intake of calcium and vitamin D, diet and exercise  return annually or prn  An After Visit Summary was printed and given to the patient.

## 2016-11-13 NOTE — Patient Instructions (Signed)

## 2016-11-27 DIAGNOSIS — T1511XA Foreign body in conjunctival sac, right eye, initial encounter: Secondary | ICD-10-CM | POA: Diagnosis not present

## 2016-12-17 DIAGNOSIS — J301 Allergic rhinitis due to pollen: Secondary | ICD-10-CM | POA: Diagnosis not present

## 2017-01-21 DIAGNOSIS — K219 Gastro-esophageal reflux disease without esophagitis: Secondary | ICD-10-CM | POA: Diagnosis not present

## 2017-01-27 DIAGNOSIS — M9903 Segmental and somatic dysfunction of lumbar region: Secondary | ICD-10-CM | POA: Diagnosis not present

## 2017-01-27 DIAGNOSIS — M7042 Prepatellar bursitis, left knee: Secondary | ICD-10-CM | POA: Diagnosis not present

## 2017-01-27 DIAGNOSIS — M9905 Segmental and somatic dysfunction of pelvic region: Secondary | ICD-10-CM | POA: Diagnosis not present

## 2017-01-27 DIAGNOSIS — M9902 Segmental and somatic dysfunction of thoracic region: Secondary | ICD-10-CM | POA: Diagnosis not present

## 2017-01-29 DIAGNOSIS — M7042 Prepatellar bursitis, left knee: Secondary | ICD-10-CM | POA: Diagnosis not present

## 2017-01-29 DIAGNOSIS — M9905 Segmental and somatic dysfunction of pelvic region: Secondary | ICD-10-CM | POA: Diagnosis not present

## 2017-01-29 DIAGNOSIS — J301 Allergic rhinitis due to pollen: Secondary | ICD-10-CM | POA: Diagnosis not present

## 2017-01-29 DIAGNOSIS — M9903 Segmental and somatic dysfunction of lumbar region: Secondary | ICD-10-CM | POA: Diagnosis not present

## 2017-01-29 DIAGNOSIS — M9902 Segmental and somatic dysfunction of thoracic region: Secondary | ICD-10-CM | POA: Diagnosis not present

## 2017-02-10 DIAGNOSIS — M9905 Segmental and somatic dysfunction of pelvic region: Secondary | ICD-10-CM | POA: Diagnosis not present

## 2017-02-10 DIAGNOSIS — M7042 Prepatellar bursitis, left knee: Secondary | ICD-10-CM | POA: Diagnosis not present

## 2017-02-10 DIAGNOSIS — M9902 Segmental and somatic dysfunction of thoracic region: Secondary | ICD-10-CM | POA: Diagnosis not present

## 2017-02-10 DIAGNOSIS — M9903 Segmental and somatic dysfunction of lumbar region: Secondary | ICD-10-CM | POA: Diagnosis not present

## 2017-02-16 DIAGNOSIS — M9903 Segmental and somatic dysfunction of lumbar region: Secondary | ICD-10-CM | POA: Diagnosis not present

## 2017-02-16 DIAGNOSIS — M9902 Segmental and somatic dysfunction of thoracic region: Secondary | ICD-10-CM | POA: Diagnosis not present

## 2017-02-16 DIAGNOSIS — M7042 Prepatellar bursitis, left knee: Secondary | ICD-10-CM | POA: Diagnosis not present

## 2017-02-16 DIAGNOSIS — M9905 Segmental and somatic dysfunction of pelvic region: Secondary | ICD-10-CM | POA: Diagnosis not present

## 2017-02-18 ENCOUNTER — Telehealth: Payer: Self-pay | Admitting: Certified Nurse Midwife

## 2017-02-18 ENCOUNTER — Encounter: Payer: Self-pay | Admitting: Certified Nurse Midwife

## 2017-02-18 ENCOUNTER — Ambulatory Visit (INDEPENDENT_AMBULATORY_CARE_PROVIDER_SITE_OTHER): Payer: BLUE CROSS/BLUE SHIELD | Admitting: Certified Nurse Midwife

## 2017-02-18 VITALS — BP 100/54 | HR 74 | Resp 12 | Wt 119.0 lb

## 2017-02-18 DIAGNOSIS — N952 Postmenopausal atrophic vaginitis: Secondary | ICD-10-CM

## 2017-02-18 NOTE — Progress Notes (Signed)
56 y.o. Married Caucasian female G2P2002 here with complaint of "cannot remove my Estring" have tried several times and it feels "stuck". Please remove, new one is due. "Have not had this issue before". Denies vaginal bleeding or soreness. Working great for atrophic vaginitis. No other health issues today.   O: Healthy WD,WN female Affect: Normal Skin:warm and dry Abdomen:soft, non tender Inguinal lymph nodes non tender or enlarged Pelvic exam:EXTERNAL GENITALIA: normal appearing vulva with no masses, tenderness or lesions VAGINA: no abnormal discharge or lesions, Estring noted in vagina, encompassing cervix, removed without difficulty and discarded(time for exchange) No bleeding noted CERVIX: Small abrasion, very slight redness, no ulceration noted,no cervical motion tenderness UTERUS: anteverted and non tender,no masses ADNEXA: non tender, no masses RECTUM: exam not indicated  A:Estring use for Atrophic Vaginitis working well Removal without difficulty Small cervical abrasion from encompassing cervix, no excoriations or ulcerations Normal pelvic exam    P: Discussed findings of Estring surrounding cervix, which can occur with moving in vagina. Discussed abrasion finding and would leave Estring for a week and then re-insert. Encouraged to check position to make sure not encompassing cervix again. Patient aware and will check. Desires continued use. Will advise if issues.   RV prn

## 2017-02-18 NOTE — Patient Instructions (Addendum)
Warm tub if discomfort. Wait one week before putting new one in

## 2017-02-18 NOTE — Telephone Encounter (Signed)
Spoke with patient. Patient states Estring is stuck and unable to remove. Was d/t be removed one week ago. Recommended OV for further evaluation, patient scheduled for 10:15 am today with Leota Sauerseborah Leonard, CNM. Patient is agreeable to date and time.   Routing to provider for final review. Patient is agreeable to disposition. Will close encounter.

## 2017-02-18 NOTE — Telephone Encounter (Signed)
Patient says her estring is stuck and she can not get it out.

## 2017-02-23 DIAGNOSIS — M9903 Segmental and somatic dysfunction of lumbar region: Secondary | ICD-10-CM | POA: Diagnosis not present

## 2017-02-23 DIAGNOSIS — M9902 Segmental and somatic dysfunction of thoracic region: Secondary | ICD-10-CM | POA: Diagnosis not present

## 2017-02-23 DIAGNOSIS — M7042 Prepatellar bursitis, left knee: Secondary | ICD-10-CM | POA: Diagnosis not present

## 2017-02-23 DIAGNOSIS — M9905 Segmental and somatic dysfunction of pelvic region: Secondary | ICD-10-CM | POA: Diagnosis not present

## 2017-02-27 DIAGNOSIS — M9902 Segmental and somatic dysfunction of thoracic region: Secondary | ICD-10-CM | POA: Diagnosis not present

## 2017-02-27 DIAGNOSIS — M7042 Prepatellar bursitis, left knee: Secondary | ICD-10-CM | POA: Diagnosis not present

## 2017-02-27 DIAGNOSIS — M9903 Segmental and somatic dysfunction of lumbar region: Secondary | ICD-10-CM | POA: Diagnosis not present

## 2017-02-27 DIAGNOSIS — M9905 Segmental and somatic dysfunction of pelvic region: Secondary | ICD-10-CM | POA: Diagnosis not present

## 2017-03-02 DIAGNOSIS — K219 Gastro-esophageal reflux disease without esophagitis: Secondary | ICD-10-CM | POA: Diagnosis not present

## 2017-03-05 DIAGNOSIS — M9902 Segmental and somatic dysfunction of thoracic region: Secondary | ICD-10-CM | POA: Diagnosis not present

## 2017-03-05 DIAGNOSIS — M7042 Prepatellar bursitis, left knee: Secondary | ICD-10-CM | POA: Diagnosis not present

## 2017-03-05 DIAGNOSIS — M9903 Segmental and somatic dysfunction of lumbar region: Secondary | ICD-10-CM | POA: Diagnosis not present

## 2017-03-05 DIAGNOSIS — M9905 Segmental and somatic dysfunction of pelvic region: Secondary | ICD-10-CM | POA: Diagnosis not present

## 2017-03-09 DIAGNOSIS — J301 Allergic rhinitis due to pollen: Secondary | ICD-10-CM | POA: Diagnosis not present

## 2017-03-11 DIAGNOSIS — M9903 Segmental and somatic dysfunction of lumbar region: Secondary | ICD-10-CM | POA: Diagnosis not present

## 2017-03-11 DIAGNOSIS — M7042 Prepatellar bursitis, left knee: Secondary | ICD-10-CM | POA: Diagnosis not present

## 2017-03-11 DIAGNOSIS — M9902 Segmental and somatic dysfunction of thoracic region: Secondary | ICD-10-CM | POA: Diagnosis not present

## 2017-03-11 DIAGNOSIS — M9905 Segmental and somatic dysfunction of pelvic region: Secondary | ICD-10-CM | POA: Diagnosis not present

## 2017-03-19 DIAGNOSIS — J301 Allergic rhinitis due to pollen: Secondary | ICD-10-CM | POA: Diagnosis not present

## 2017-03-20 DIAGNOSIS — M9905 Segmental and somatic dysfunction of pelvic region: Secondary | ICD-10-CM | POA: Diagnosis not present

## 2017-03-20 DIAGNOSIS — M9903 Segmental and somatic dysfunction of lumbar region: Secondary | ICD-10-CM | POA: Diagnosis not present

## 2017-03-20 DIAGNOSIS — M9902 Segmental and somatic dysfunction of thoracic region: Secondary | ICD-10-CM | POA: Diagnosis not present

## 2017-03-20 DIAGNOSIS — M7042 Prepatellar bursitis, left knee: Secondary | ICD-10-CM | POA: Diagnosis not present

## 2017-03-31 DIAGNOSIS — M9905 Segmental and somatic dysfunction of pelvic region: Secondary | ICD-10-CM | POA: Diagnosis not present

## 2017-03-31 DIAGNOSIS — M7042 Prepatellar bursitis, left knee: Secondary | ICD-10-CM | POA: Diagnosis not present

## 2017-03-31 DIAGNOSIS — M9903 Segmental and somatic dysfunction of lumbar region: Secondary | ICD-10-CM | POA: Diagnosis not present

## 2017-03-31 DIAGNOSIS — M9902 Segmental and somatic dysfunction of thoracic region: Secondary | ICD-10-CM | POA: Diagnosis not present

## 2017-04-16 DIAGNOSIS — M7042 Prepatellar bursitis, left knee: Secondary | ICD-10-CM | POA: Diagnosis not present

## 2017-04-16 DIAGNOSIS — M9905 Segmental and somatic dysfunction of pelvic region: Secondary | ICD-10-CM | POA: Diagnosis not present

## 2017-04-16 DIAGNOSIS — M9902 Segmental and somatic dysfunction of thoracic region: Secondary | ICD-10-CM | POA: Diagnosis not present

## 2017-04-16 DIAGNOSIS — M9903 Segmental and somatic dysfunction of lumbar region: Secondary | ICD-10-CM | POA: Diagnosis not present

## 2017-04-24 DIAGNOSIS — J301 Allergic rhinitis due to pollen: Secondary | ICD-10-CM | POA: Diagnosis not present

## 2017-04-29 DIAGNOSIS — J301 Allergic rhinitis due to pollen: Secondary | ICD-10-CM | POA: Diagnosis not present

## 2017-05-01 DIAGNOSIS — J301 Allergic rhinitis due to pollen: Secondary | ICD-10-CM | POA: Diagnosis not present

## 2017-05-04 DIAGNOSIS — J301 Allergic rhinitis due to pollen: Secondary | ICD-10-CM | POA: Diagnosis not present

## 2017-05-07 DIAGNOSIS — J301 Allergic rhinitis due to pollen: Secondary | ICD-10-CM | POA: Diagnosis not present

## 2017-05-07 DIAGNOSIS — M9902 Segmental and somatic dysfunction of thoracic region: Secondary | ICD-10-CM | POA: Diagnosis not present

## 2017-05-07 DIAGNOSIS — M9903 Segmental and somatic dysfunction of lumbar region: Secondary | ICD-10-CM | POA: Diagnosis not present

## 2017-05-07 DIAGNOSIS — M9905 Segmental and somatic dysfunction of pelvic region: Secondary | ICD-10-CM | POA: Diagnosis not present

## 2017-05-07 DIAGNOSIS — M7042 Prepatellar bursitis, left knee: Secondary | ICD-10-CM | POA: Diagnosis not present

## 2017-05-27 ENCOUNTER — Encounter: Payer: Self-pay | Admitting: Certified Nurse Midwife

## 2017-05-27 ENCOUNTER — Ambulatory Visit: Payer: BLUE CROSS/BLUE SHIELD | Admitting: Certified Nurse Midwife

## 2017-05-27 ENCOUNTER — Other Ambulatory Visit: Payer: Self-pay

## 2017-05-27 VITALS — BP 104/68 | HR 64 | Resp 16 | Ht 62.75 in | Wt 121.0 lb

## 2017-05-27 DIAGNOSIS — N952 Postmenopausal atrophic vaginitis: Secondary | ICD-10-CM

## 2017-05-27 NOTE — Progress Notes (Signed)
57 y.o. Married Caucasian female G2P2002 here for removal of Estring being used vaginal dryness. Patient has had difficulty removing it and prefer removal here. She has new one to insert and can do this with out problems.Denies vaginal bleeding or vaginal dryness or pelvic pain. No other concerns today.  O: Healthy WD,WN female Affect: normal Skin:warm and dry Abdomen:soft,non tender Pelvic exam:   External genital:  Normal female, no lesions Vagina: moist, non tender, no masses, Estring palpated and removed without difficulty, no bleeding noted Cervix palpated normal, no CMT Uterus: normal, no masses or tenderness Adnexae:normal no masses or tenderness   A:Atrophic Vaginitis with good results with Estring use   P: Discussed findings of normal pelvic exam with palpation only. Estring removed with out difficulty. Patient to insert new Estring when she picks at pharmacy and returns home. Warning signs of Estring use reviewed and need to advise.Questions addressed.  Rv prn, aex

## 2017-05-28 DIAGNOSIS — M9905 Segmental and somatic dysfunction of pelvic region: Secondary | ICD-10-CM | POA: Diagnosis not present

## 2017-05-28 DIAGNOSIS — M9903 Segmental and somatic dysfunction of lumbar region: Secondary | ICD-10-CM | POA: Diagnosis not present

## 2017-05-28 DIAGNOSIS — M9902 Segmental and somatic dysfunction of thoracic region: Secondary | ICD-10-CM | POA: Diagnosis not present

## 2017-05-28 DIAGNOSIS — M7042 Prepatellar bursitis, left knee: Secondary | ICD-10-CM | POA: Diagnosis not present

## 2017-06-10 DIAGNOSIS — M7042 Prepatellar bursitis, left knee: Secondary | ICD-10-CM | POA: Diagnosis not present

## 2017-06-10 DIAGNOSIS — M9903 Segmental and somatic dysfunction of lumbar region: Secondary | ICD-10-CM | POA: Diagnosis not present

## 2017-06-10 DIAGNOSIS — M9902 Segmental and somatic dysfunction of thoracic region: Secondary | ICD-10-CM | POA: Diagnosis not present

## 2017-06-10 DIAGNOSIS — M9905 Segmental and somatic dysfunction of pelvic region: Secondary | ICD-10-CM | POA: Diagnosis not present

## 2017-06-12 DIAGNOSIS — H11153 Pinguecula, bilateral: Secondary | ICD-10-CM | POA: Diagnosis not present

## 2017-06-12 DIAGNOSIS — H2513 Age-related nuclear cataract, bilateral: Secondary | ICD-10-CM | POA: Diagnosis not present

## 2017-06-12 DIAGNOSIS — H18413 Arcus senilis, bilateral: Secondary | ICD-10-CM | POA: Diagnosis not present

## 2017-06-15 DIAGNOSIS — J301 Allergic rhinitis due to pollen: Secondary | ICD-10-CM | POA: Diagnosis not present

## 2017-06-15 DIAGNOSIS — J3089 Other allergic rhinitis: Secondary | ICD-10-CM | POA: Diagnosis not present

## 2017-06-24 DIAGNOSIS — M545 Low back pain: Secondary | ICD-10-CM | POA: Diagnosis not present

## 2017-06-24 DIAGNOSIS — M25551 Pain in right hip: Secondary | ICD-10-CM | POA: Diagnosis not present

## 2017-06-24 DIAGNOSIS — S9032XD Contusion of left foot, subsequent encounter: Secondary | ICD-10-CM | POA: Diagnosis not present

## 2017-07-01 DIAGNOSIS — M7042 Prepatellar bursitis, left knee: Secondary | ICD-10-CM | POA: Diagnosis not present

## 2017-07-01 DIAGNOSIS — M9905 Segmental and somatic dysfunction of pelvic region: Secondary | ICD-10-CM | POA: Diagnosis not present

## 2017-07-01 DIAGNOSIS — M9903 Segmental and somatic dysfunction of lumbar region: Secondary | ICD-10-CM | POA: Diagnosis not present

## 2017-07-01 DIAGNOSIS — M9902 Segmental and somatic dysfunction of thoracic region: Secondary | ICD-10-CM | POA: Diagnosis not present

## 2017-07-15 ENCOUNTER — Ambulatory Visit: Payer: BLUE CROSS/BLUE SHIELD | Admitting: Certified Nurse Midwife

## 2017-07-15 ENCOUNTER — Telehealth: Payer: Self-pay | Admitting: Certified Nurse Midwife

## 2017-07-15 ENCOUNTER — Other Ambulatory Visit: Payer: Self-pay

## 2017-07-15 ENCOUNTER — Other Ambulatory Visit: Payer: Self-pay | Admitting: Certified Nurse Midwife

## 2017-07-15 ENCOUNTER — Encounter: Payer: Self-pay | Admitting: Certified Nurse Midwife

## 2017-07-15 VITALS — BP 90/60 | HR 60 | Temp 98.2°F | Resp 16 | Ht 62.75 in | Wt 120.0 lb

## 2017-07-15 DIAGNOSIS — N390 Urinary tract infection, site not specified: Secondary | ICD-10-CM

## 2017-07-15 DIAGNOSIS — Z1231 Encounter for screening mammogram for malignant neoplasm of breast: Secondary | ICD-10-CM

## 2017-07-15 DIAGNOSIS — R319 Hematuria, unspecified: Secondary | ICD-10-CM

## 2017-07-15 LAB — POCT URINALYSIS DIPSTICK
Bilirubin, UA: NEGATIVE
GLUCOSE UA: NEGATIVE
Ketones, UA: NEGATIVE
Nitrite, UA: NEGATIVE
PH UA: 5 (ref 5.0–8.0)
UROBILINOGEN UA: NEGATIVE U/dL — AB

## 2017-07-15 MED ORDER — CEPHALEXIN 500 MG PO CAPS: ORAL_CAPSULE | ORAL | 0 refills | Status: DC

## 2017-07-15 NOTE — Telephone Encounter (Signed)
Patient is asking for a new prescription for Cephalaxin. Confirmed pharmacy on file. Patient stated that Shirlyn GoltzPatty Grubb, FNP had prescribed this in the past.

## 2017-07-15 NOTE — Patient Instructions (Signed)

## 2017-07-15 NOTE — Progress Notes (Signed)
57 y.o. Married Caucasian female G2P2002 here with complaint of UTI, with onset  in last 24 hours. Patient complaining of urinary frequency/urgency/ and pain with urination. Patient denies fever, chills, nausea. Some back pain, but feels it was riding bikes long periods of time..  No new personal products. Patient feels could be related to sexual activity. Denies any vaginal symptoms.    . Menopausal with vaginal dryness, uses Estring with good results.. Patient trying to consume adequate water intake. No other issues today.   ROS pertinent to HPI  O: Healthy female WDWN Affect: Normal, orientation x 3 Skin : warm and dry CVAT: negative bilateral Abdomen: positive for suprapubic tenderness  Pelvic exam: External genital area: normal, no lesions Bladder,Urethra tender, Urethral meatus: tender, red, prominent Vagina: normal vaginal discharge, normal appearance  Estring noted in vagina Cervix: normal, non tender Uterus:normal,non tender Adnexa: normal non tender, no fullness or masses   A: UTI with hematuria ? Post coitus Normal pelvic exam poct urine- rbc 2+, wbc 2+, protein trace  P: Reviewed findings of UTI and need for treatment.  Discussed biking may increase risk with moisture or long periods of holding urine. Be aware of need to hydrate well questions addressed. ZO:[id number]:Keflex see order with instructions(chosen due to severe corn allergies) has used before with good results. VWU:[id number]:Urine micro, culture Reviewed warning signs and symptoms of UTI and need to advise if occurring. Encouraged to limit soda, tea, and coffee and be sure to increase water intake.   RV prn

## 2017-07-15 NOTE — Telephone Encounter (Signed)
Spoke with patient. Reports symptoms of urinary frequency, small amounts of urine, burning with urination and blood in urine started 3/12. Patient requesting RX for cephalexin.   Denies fever/chills, N/V.  Advised OV needed for further evaluation, declined OV for 3/14 d/t discomfort. Advised will review with Leota Sauerseborah Leonard, CNM and return call.

## 2017-07-15 NOTE — Telephone Encounter (Signed)
Reviewed with Leota Sauerseborah Leonard, CNM, call returned to patient. Patient states she is 30 minutes away. OV scheduled for 11:15 am, aware she will be worked into schedule. Patient verbalizes understanding and is agreeable.   Routing to provider for final review. Patient is agreeable to disposition. Will close encounter.

## 2017-07-16 LAB — URINALYSIS, MICROSCOPIC ONLY
Casts: NONE SEEN /lpf
WBC, UA: >30 /hpf — AB (ref 0–?)

## 2017-07-17 LAB — URINE CULTURE

## 2017-07-23 ENCOUNTER — Other Ambulatory Visit: Payer: Self-pay | Admitting: Obstetrics and Gynecology

## 2017-07-23 ENCOUNTER — Telehealth: Payer: Self-pay | Admitting: Certified Nurse Midwife

## 2017-07-23 MED ORDER — AMPICILLIN 500 MG PO CAPS: 500.0000 mg | ORAL_CAPSULE | Freq: Four times a day (QID) | ORAL | 0 refills | Status: DC

## 2017-07-23 NOTE — Telephone Encounter (Signed)
I just sent through an Rx for Ampicillin 500 mg po qid x 7 days.  Call back for continued or worsening symptoms.

## 2017-07-23 NOTE — Telephone Encounter (Signed)
Call returned, left detailed message, ok per dpr. Advised as seen below per Dr. Edward JollySilva. Advised Rx [facility]. Return call to office with any additional questions.  Routing to provider for final review. Patient is agreeable to disposition. Will close encounter.

## 2017-07-23 NOTE — Telephone Encounter (Signed)
Patient is still having symptoms as before and is not getting better.

## 2017-07-23 NOTE — Telephone Encounter (Signed)
Spoke with patient. Seen in office on 07/15/17 for UTI, tx with keflex. Reports symptoms of frequency, pressure and burning with urination have not resolved, completed abx on 3/18.   Denies fever/chills, N/V, lower back pain.   Patient states antibiotic choices are limited for her d/t corn allergy.  Advised patient Leota SauersDeborah Leonard, CNM is out of the office, will review with covering provider and return call, patient agreeable.   Dr. Edward JollySilva -please review and advise?   Cc: Leota Sauerseborah Leonard, CNM

## 2017-07-27 DIAGNOSIS — J301 Allergic rhinitis due to pollen: Secondary | ICD-10-CM | POA: Diagnosis not present

## 2017-07-28 ENCOUNTER — Telehealth: Payer: Self-pay | Admitting: Certified Nurse Midwife

## 2017-07-28 NOTE — Telephone Encounter (Signed)
I think she needs to be seen and due to second course of antibiotics evaluate also.

## 2017-07-28 NOTE — Telephone Encounter (Signed)
Was put on antibiotics for a UTI and is continuing to have problems. Possible yeast infection?

## 2017-07-28 NOTE — Telephone Encounter (Signed)
Spoke with patient. Patient states that she was started on Keflex for UTI then was switched to Ampicillin. Is on day 4 or 5. Reports she is now having vaginal irritation and soreness. Unsure if she is having discharge because she is using the restroom often. Reports she has an Estring and last time she had a yeast infection she did not notice discharge because of her Estring. Advised will review with Leota Sauerseborah Leonard CNM and return call with further recommendations.

## 2017-07-28 NOTE — Telephone Encounter (Signed)
Spoke with patient, advised as seen below per Leota Sauerseborah Leonard, CNM. OV scheduled for 3/27 at 9:30am with Leota Sauerseborah Leonard, CNM. Patient declined OV for today. Patient verbalizes understanding and is agreeable, will close encounter.

## 2017-07-29 ENCOUNTER — Encounter: Payer: Self-pay | Admitting: Certified Nurse Midwife

## 2017-07-29 ENCOUNTER — Other Ambulatory Visit: Payer: Self-pay

## 2017-07-29 ENCOUNTER — Ambulatory Visit: Payer: BLUE CROSS/BLUE SHIELD | Admitting: Certified Nurse Midwife

## 2017-07-29 VITALS — BP 96/60 | HR 60 | Temp 97.8°F | Resp 16 | Ht 62.75 in | Wt 119.0 lb

## 2017-07-29 DIAGNOSIS — R350 Frequency of micturition: Secondary | ICD-10-CM | POA: Diagnosis not present

## 2017-07-29 DIAGNOSIS — B373 Candidiasis of vulva and vagina: Secondary | ICD-10-CM | POA: Diagnosis not present

## 2017-07-29 DIAGNOSIS — N952 Postmenopausal atrophic vaginitis: Secondary | ICD-10-CM

## 2017-07-29 DIAGNOSIS — Z87440 Personal history of urinary (tract) infections: Secondary | ICD-10-CM | POA: Diagnosis not present

## 2017-07-29 DIAGNOSIS — N949 Unspecified condition associated with female genital organs and menstrual cycle: Secondary | ICD-10-CM

## 2017-07-29 DIAGNOSIS — M9903 Segmental and somatic dysfunction of lumbar region: Secondary | ICD-10-CM | POA: Diagnosis not present

## 2017-07-29 DIAGNOSIS — M7042 Prepatellar bursitis, left knee: Secondary | ICD-10-CM | POA: Diagnosis not present

## 2017-07-29 DIAGNOSIS — M9905 Segmental and somatic dysfunction of pelvic region: Secondary | ICD-10-CM | POA: Diagnosis not present

## 2017-07-29 DIAGNOSIS — B3731 Acute candidiasis of vulva and vagina: Secondary | ICD-10-CM

## 2017-07-29 DIAGNOSIS — M9902 Segmental and somatic dysfunction of thoracic region: Secondary | ICD-10-CM | POA: Diagnosis not present

## 2017-07-29 LAB — POCT URINALYSIS DIPSTICK
Bilirubin, UA: NEGATIVE
Blood, UA: NEGATIVE
Glucose, UA: NEGATIVE
Ketones, UA: NEGATIVE
LEUKOCYTES UA: NEGATIVE
NITRITE UA: NEGATIVE
PROTEIN UA: NEGATIVE
Urobilinogen, UA: NEGATIVE E.U./dL — AB
pH, UA: 5 (ref 5.0–8.0)

## 2017-07-29 MED ORDER — TERCONAZOLE 0.4 % VA CREA: 1.0000 | TOPICAL_CREAM | Freq: Every day | VAGINAL | 0 refills | Status: DC

## 2017-07-29 NOTE — Progress Notes (Signed)
57 y.o. Married Caucasian female G2P2002 here with complaint of vaginal symptoms soreness and burning. Patient had recent treatment for UTI with 2 rounds of antibiotic with last dose of Ampicillin today. Noted slight redness of face the past 2-3 days, but no itching or raised rash. Urinary symptoms are resolving and still some frequency at times, no pain.Feels like some vaginal issues now.  Denies new personal products or vaginal dryness. No  STD concerns.   Patient using Estring for vaginal dryness. Took Aveeno sitz bath last pm with some relief. No other concerns today.  ROS  Pertinent to HPI  O:Healthy female WDWN Affect: normal, orientation x 3  Exam:Skin: warm and dry, face no rash or hives noted or on back or arms CVAT: negative bilateral Abdomen:soft, non tender, suprapubic non tender Lymph node: no enlargement or tenderness Pelvic exam: External genital: normal female with redness noted on vulva and introitus, wet prep taken BUS: negative Urethral meatus, not red, non tender, urethra and bladder non tender Vagina: scant white  discharge noted , no odor. Ph:4.0  ,Wet prep taken, Affirm taken Estring noted in place Cervix: normal, non tender, no CMT Uterus: normal, non tender Adnexa:normal, non tender, no masses or fullness noted   Wet Prep results: KOH, Saline + yeast poct urine-neg  A:Normal pelvic exam History of UTI now resolving with history of Ampicillin and Keflex use Yeast vaginitis/vulvitis Vaginal dryness with Estring use, no issues   P:Discussed findings of normal pelvic exam and etiology. No indication of UTI now, but will send culture. Discussed Aveeno or baking soda sitz bath for comfort if needed. Discussed Yeast finding, which can occur with antibiotic use. Questions addressed. Patient will call if no change in symptoms. Rx Terazol 7 vaginal cream see order with instructions. . Avoid moist clothes . If working out in gym clothes or swim suits for long periods of  time change underwear or bottoms of swimsuit if possible. Coconut Oil use for skin protection prior to activity can be used to external skin for protection or dryness. Labs: Affirm, Urine culture  Rv prn

## 2017-07-29 NOTE — Patient Instructions (Signed)

## 2017-07-30 LAB — VAGINITIS/VAGINOSIS, DNA PROBE
Candida Species: NEGATIVE
GARDNERELLA VAGINALIS: NEGATIVE
TRICHOMONAS VAG: NEGATIVE

## 2017-07-30 LAB — URINE CULTURE: Organism ID, Bacteria: NO GROWTH

## 2017-07-31 ENCOUNTER — Ambulatory Visit: Payer: BLUE CROSS/BLUE SHIELD

## 2017-08-13 ENCOUNTER — Ambulatory Visit: Payer: BLUE CROSS/BLUE SHIELD | Admitting: Certified Nurse Midwife

## 2017-08-13 ENCOUNTER — Encounter: Payer: Self-pay | Admitting: Certified Nurse Midwife

## 2017-08-13 VITALS — BP 100/62 | HR 70 | Ht 62.75 in | Wt 120.0 lb

## 2017-08-13 DIAGNOSIS — N952 Postmenopausal atrophic vaginitis: Secondary | ICD-10-CM

## 2017-08-13 DIAGNOSIS — N898 Other specified noninflammatory disorders of vagina: Secondary | ICD-10-CM | POA: Diagnosis not present

## 2017-08-13 DIAGNOSIS — N949 Unspecified condition associated with female genital organs and menstrual cycle: Secondary | ICD-10-CM | POA: Diagnosis not present

## 2017-08-13 NOTE — Progress Notes (Signed)
57 y.o. Married Caucasian female G2P2002 here with complaint of vaginal symptoms of  burning, and no increase in discharge. Describes discharge as scant to none,no odor  Works out, and exercises and is outdoors a lot, but does not feel this changes occurrence..Treated for yeast vaginitis 2 weeks ago, but feels like it did not clear. Denies new personal products or vaginal dryness at present. No STD concerns. Urinary symptoms none . Menopausal using Estring for dryness and coconut oil external as needed without problems. Estring due to come out please remove. "Just feel like my life revolves around my vagina".  Review of Systems  Constitutional: Negative for chills and fever.  Gastrointestinal: Negative for abdominal pain.  Genitourinary: Negative for dysuria, flank pain, frequency and urgency.   Pertinent to above  O:Healthy female WDWN Affect: normal, orientation x 3  Exam:Skin: warm and dry Abdomen:soft, non tender, no masses  Inguinal Lymph nodes: no enlargement or tenderness Pelvic exam: External genital: normal female, slight atrophic appearance, no dryness, scaling, lesions or redness noted BUS: negative Vagina: white non odorous discharge noted., Affirm taken after removal of Estring Cervix: normal, non tender, no CMT Uterus: normal, non tender Adnexa:normal, non tender, no masses or fullness noted   A:Normal pelvic exam Atrophic vaginitis using Estring with good results R/O vaginal infection   P:Discussed findings of normal appearance, moisture in vagina and no odor noted. Discussed Aveeno or baking soda sitz bath or wash while in shower if no tub, for comfort. Avoid moist clothes  for extended period of time. If working out in gym clothes or swim suits for long periods of time change underwear or bottoms of swimsuit if possible. Coconut Oil use for skin protection as she is doing prior to activity.  Be sure to re-insert new Estring.  Questions addressed.  Rv prn

## 2017-08-14 ENCOUNTER — Other Ambulatory Visit: Payer: Self-pay

## 2017-08-14 ENCOUNTER — Encounter: Payer: Self-pay | Admitting: Certified Nurse Midwife

## 2017-08-14 ENCOUNTER — Other Ambulatory Visit: Payer: Self-pay | Admitting: Certified Nurse Midwife

## 2017-08-14 LAB — VAGINITIS/VAGINOSIS, DNA PROBE
CANDIDA SPECIES: NEGATIVE
Gardnerella vaginalis: POSITIVE — AB
TRICHOMONAS VAG: NEGATIVE

## 2017-08-14 MED ORDER — METRONIDAZOLE 500 MG PO TABS: 500.0000 mg | ORAL_TABLET | Freq: Two times a day (BID) | ORAL | 0 refills | Status: DC

## 2017-08-19 ENCOUNTER — Ambulatory Visit
Admission: RE | Admit: 2017-08-19 | Discharge: 2017-08-19 | Disposition: A | Discharge status: Home / Self Care | Payer: BLUE CROSS/BLUE SHIELD | Source: Ambulatory Visit | Attending: Certified Nurse Midwife | Admitting: Certified Nurse Midwife

## 2017-08-19 ENCOUNTER — Telehealth: Payer: Self-pay | Admitting: Certified Nurse Midwife

## 2017-08-19 DIAGNOSIS — Z1231 Encounter for screening mammogram for malignant neoplasm of breast: Secondary | ICD-10-CM

## 2017-08-19 NOTE — Telephone Encounter (Signed)
Spoke with patient. Advised of message as seen below from Deborah Leonard CNM. Patient verbalizes understanding. Encounter closed. 

## 2017-08-19 NOTE — Telephone Encounter (Signed)
Patient is still having UTI symptoms after being treated..Marland Kitchen

## 2017-08-19 NOTE — Telephone Encounter (Signed)
Patient was seen recently and had BV which she still should be treating for per date. She probably is  having some burning from the BV and medication use. She can take some Azo tid x 2 days to see if this resolves. She needs to put Estring back in as soon as she completes Metrogel. She needs to use Aveeno sitz bath to help with symptoms as we discussed.

## 2017-08-19 NOTE — Telephone Encounter (Signed)
Spoke with patient. Patient states that she is having increased urinary frequency, burning with urination, a little vaginal discharge, and soreness. Feels symptoms are more related to a UTI. Last urine culture on 3/27 was negative. Feels UTI never fully went away from 3/13. Advised will need to be seen in the office for further evaluation. Asking if RN can speak with Leota Sauerseborah Leonard CNM as she has been in many time lately. Advised will review with Leota Sauerseborah Leonard CNM and return call.

## 2017-08-20 ENCOUNTER — Ambulatory Visit: Payer: BLUE CROSS/BLUE SHIELD

## 2017-08-20 DIAGNOSIS — M9902 Segmental and somatic dysfunction of thoracic region: Secondary | ICD-10-CM | POA: Diagnosis not present

## 2017-08-20 DIAGNOSIS — M7042 Prepatellar bursitis, left knee: Secondary | ICD-10-CM | POA: Diagnosis not present

## 2017-08-20 DIAGNOSIS — M9903 Segmental and somatic dysfunction of lumbar region: Secondary | ICD-10-CM | POA: Diagnosis not present

## 2017-08-20 DIAGNOSIS — J301 Allergic rhinitis due to pollen: Secondary | ICD-10-CM | POA: Diagnosis not present

## 2017-08-20 DIAGNOSIS — Z91018 Allergy to other foods: Secondary | ICD-10-CM | POA: Diagnosis not present

## 2017-08-20 DIAGNOSIS — K219 Gastro-esophageal reflux disease without esophagitis: Secondary | ICD-10-CM | POA: Diagnosis not present

## 2017-08-20 DIAGNOSIS — M9905 Segmental and somatic dysfunction of pelvic region: Secondary | ICD-10-CM | POA: Diagnosis not present

## 2017-09-07 DIAGNOSIS — J301 Allergic rhinitis due to pollen: Secondary | ICD-10-CM | POA: Diagnosis not present

## 2017-09-08 DIAGNOSIS — M7042 Prepatellar bursitis, left knee: Secondary | ICD-10-CM | POA: Diagnosis not present

## 2017-09-08 DIAGNOSIS — M9903 Segmental and somatic dysfunction of lumbar region: Secondary | ICD-10-CM | POA: Diagnosis not present

## 2017-09-08 DIAGNOSIS — M9902 Segmental and somatic dysfunction of thoracic region: Secondary | ICD-10-CM | POA: Diagnosis not present

## 2017-09-08 DIAGNOSIS — M9905 Segmental and somatic dysfunction of pelvic region: Secondary | ICD-10-CM | POA: Diagnosis not present

## 2017-09-14 ENCOUNTER — Ambulatory Visit (INDEPENDENT_AMBULATORY_CARE_PROVIDER_SITE_OTHER): Payer: BLUE CROSS/BLUE SHIELD | Admitting: Obstetrics and Gynecology

## 2017-09-14 ENCOUNTER — Encounter: Payer: Self-pay | Admitting: Obstetrics and Gynecology

## 2017-09-14 ENCOUNTER — Other Ambulatory Visit: Payer: Self-pay

## 2017-09-14 VITALS — BP 90/58 | HR 14 | Resp 14 | Wt 119.0 lb

## 2017-09-14 DIAGNOSIS — N760 Acute vaginitis: Secondary | ICD-10-CM

## 2017-09-14 DIAGNOSIS — N9489 Other specified conditions associated with female genital organs and menstrual cycle: Secondary | ICD-10-CM | POA: Diagnosis not present

## 2017-09-14 DIAGNOSIS — R102 Pelvic and perineal pain: Secondary | ICD-10-CM

## 2017-09-14 MED ORDER — LIDOCAINE 5 % EX OINT: 1.0000 "application " | TOPICAL_OINTMENT | Freq: Four times a day (QID) | CUTANEOUS | 0 refills | Status: DC | PRN

## 2017-09-14 NOTE — Progress Notes (Signed)
GYNECOLOGY  VISIT   HPI: 57 y.o.   Married  Caucasian  female   G2P2002 with Patient's last menstrual period was 09/28/2008 (exact date).   here c/o vaginal discharge and irritation for a couple of months. She was seen by Sara Chu, CNM last month and was treated for BV. Her symptoms never resolved, now worse again. She c/o a burning and soreness at the opening of the vagina. Slight white d/c. No odor.  She feels some intermittent abdominal menstrual like cramp for the last few days, 3/10 in severity. No bowel changes.  No urinary frequency, urgency or pain with urination.  The pain at the opening of the vagina started a few months ago. She felt more uncomfortable after sex last week. Always has some discomfort with intercourse.  Feels aware of her vagina all the time.   She has an estring for atrophy.  GYNECOLOGIC HISTORY: Patient's last menstrual period was 09/28/2008 (exact date). Contraception:postmenopause  Menopausal hormone therapy: estradiol         OB History    Gravida  2   Para  2   Term  2   Preterm  0   AB  0   Living  2     SAB  0   TAB  0   Ectopic  0   Multiple  0   Live Births  2              Patient Active Problem List   Diagnosis Date Noted  . Other pancytopenia (HCC) 11/09/2014  . 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 Anemia   . Anxiety    with air travel   . Dysmenorrhea   . H/O renal calculi   . Mastalgia   . Ovarian cyst     Past Surgical History:  Procedure Laterality Date  . COLONOSCOPY  10/25/10   negative recheckin 10 years  . WISDOM TOOTH EXTRACTION      Current Outpatient Medications  Medication Sig Dispense Refill  . DEXILANT 60 MG capsule as needed.  0  . estradiol (ESTRING) 2 MG vaginal ring Place 2 mg vaginally every 3 (three) months. Insert a new ring into vagina every 3 months 1 each 4  . Probiotic Product (PROBIOTIC PO) Take by mouth.    Marland Kitchen UNABLE TO  FIND Allergy Injections once a month     No current facility-administered medications for this visit.      ALLERGIES: Corn-containing products and Sulfa antibiotics  Family History  Problem Relation Age of Onset  . Hypertension Mother   . Thyroid disease Mother   . Heart failure Maternal Grandmother   . Stroke Paternal Grandmother   . Heart failure Paternal Uncle     Social History   Socioeconomic History  . Marital status: Married    Spouse name: Not on file  . Number of children: Not on file  . Years of education: Not on file  . Highest education level: Not on file  Occupational History  . Not on file  Social Needs  . Financial resource strain: Not on file  . Food insecurity:    Worry: Not on file    Inability: Not on file  . Transportation needs:    Medical: Not on file    Non-medical: Not on file  Tobacco Use  . Smoking status: Never Smoker  . Smokeless tobacco: Never Used  Substance and Sexual Activity  . Alcohol use:  Yes    Alcohol/week: 0.0 - 0.6 oz    Comment: rarely  . Drug use: No  . Sexual activity: Yes    Partners: Male    Birth control/protection: Post-menopausal  Lifestyle  . Physical activity:    Days per week: Not on file    Minutes per session: Not on file  . Stress: Not on file  Relationships  . Social connections:    Talks on phone: Not on file    Gets together: Not on file    Attends religious service: Not on file    Active member of club or organization: Not on file    Attends meetings of clubs or organizations: Not on file    Relationship status: Not on file  . Intimate partner violence:    Fear of current or ex partner: Not on file    Emotionally abused: Not on file    Physically abused: Not on file    Forced sexual activity: Not on file  Other Topics Concern  . Not on file  Social History Narrative  . Not on file    Review of Systems  Constitutional: Negative.   HENT: Negative.   Eyes: Negative.   Respiratory: Negative.    Cardiovascular: Negative.   Gastrointestinal: Negative.   Genitourinary:       Vaginal discharge and irritation   Musculoskeletal: Negative.   Skin: Negative.   Neurological: Negative.   Endo/Heme/Allergies: Negative.   Psychiatric/Behavioral: Negative.     PHYSICAL EXAMINATION:    BP (!) 90/58 (BP Location: Right Arm, Patient Position: Sitting, Cuff Size: Normal)   Pulse (!) 14   Resp 14   Wt 119 lb (54 kg)   LMP 09/28/2008 (Exact Date)   BMI 21.25 kg/m     General appearance: alert, cooperative and appears stated age Abdomen: soft, non-tender; non distended, no masses,  no organomegaly  Pelvic: External genitalia:  no lesions, tender at the vestibule in all 4 quadrants.               Urethra:  normal appearing urethra with no masses, tenderness or lesions              Bartholins and Skenes: normal                 Vagina: normal appearing vagina with normal color a slight increase in white, watery/creamy vaginal d/c              Cervix: no cervical motion tenderness and no lesions              Bimanual Exam:  Uterus:  normal size, contour, position, consistency, mobility, non-tender              Adnexa: no mass, fullness, tenderness                Chaperone was present for exam.  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 Also discussed the option of gabapentin   An After Visit Summary was printed and given to the patient.  ~20 minutes face to face time of which over 50% was spent in counseling.

## 2017-09-15 ENCOUNTER — Telehealth: Payer: Self-pay

## 2017-09-15 LAB — VAGINITIS/VAGINOSIS, DNA PROBE
Candida Species: NEGATIVE
Gardnerella vaginalis: POSITIVE — AB
TRICHOMONAS VAG: NEGATIVE

## 2017-09-15 MED ORDER — METRONIDAZOLE 500 MG PO TABS: 500.0000 mg | ORAL_TABLET | Freq: Two times a day (BID) | ORAL | 0 refills | Status: DC

## 2017-09-15 MED ORDER — FLUCONAZOLE 150 MG PO TABS: 150.0000 mg | ORAL_TABLET | Freq: Once | ORAL | 0 refills | Status: AC

## 2017-09-15 NOTE — Telephone Encounter (Signed)
-----   Message from Romualdo Bolk, MD sent at 09/15/2017  5:12 PM EDT ----- Please inform the patient that her vaginitis probe was + for BV and treat with flagyl (either oral or vaginal, her choice), no ETOH while on Flagyl.  Oral: Flagyl 500 mg BID x 7 days, or Vaginal: Metrogel, 1 applicator per vagina q day x 5 days. She is leaving the country on Friday She should f/u when she returns if she doesn't feel better. Let her know that condoms can decrease the recurrence of BV.

## 2017-09-15 NOTE — Telephone Encounter (Signed)
Spoke with patient. Advised of results as seen below from Dr.Jertson. Patient verbalizes understanding. Rx for Flagyl 500 mg po BID x 7 days 14 0RF sent to pharmacy on file. Patient requesting Diflucan as well as she will be out of the country for almost 3 weeks. Reviewed with Dr.Jertson and rx for Diflucan 150 mg po once repeat in 72 hours if symptoms persist #2 0RF sent to pharmacy on file. Avoid alcohol during treatment and 24 hours after completing medication. Don't mix with alcohol if mixed can cause severe nausea, vomiting and abdominal cramping. Advised to take medication with food. Patient verbalizes understanding. Encounter closed.

## 2017-10-08 DIAGNOSIS — M9905 Segmental and somatic dysfunction of pelvic region: Secondary | ICD-10-CM | POA: Diagnosis not present

## 2017-10-08 DIAGNOSIS — M9902 Segmental and somatic dysfunction of thoracic region: Secondary | ICD-10-CM | POA: Diagnosis not present

## 2017-10-08 DIAGNOSIS — M7042 Prepatellar bursitis, left knee: Secondary | ICD-10-CM | POA: Diagnosis not present

## 2017-10-08 DIAGNOSIS — M9903 Segmental and somatic dysfunction of lumbar region: Secondary | ICD-10-CM | POA: Diagnosis not present

## 2017-10-16 DIAGNOSIS — M9903 Segmental and somatic dysfunction of lumbar region: Secondary | ICD-10-CM | POA: Diagnosis not present

## 2017-10-16 DIAGNOSIS — M7042 Prepatellar bursitis, left knee: Secondary | ICD-10-CM | POA: Diagnosis not present

## 2017-10-16 DIAGNOSIS — M9902 Segmental and somatic dysfunction of thoracic region: Secondary | ICD-10-CM | POA: Diagnosis not present

## 2017-10-16 DIAGNOSIS — M9905 Segmental and somatic dysfunction of pelvic region: Secondary | ICD-10-CM | POA: Diagnosis not present

## 2017-10-20 DIAGNOSIS — J301 Allergic rhinitis due to pollen: Secondary | ICD-10-CM | POA: Diagnosis not present

## 2017-11-07 DIAGNOSIS — S80822A Blister (nonthermal), left lower leg, initial encounter: Secondary | ICD-10-CM | POA: Diagnosis not present

## 2017-11-17 DIAGNOSIS — H6122 Impacted cerumen, left ear: Secondary | ICD-10-CM | POA: Diagnosis not present

## 2017-11-18 ENCOUNTER — Ambulatory Visit: Payer: BLUE CROSS/BLUE SHIELD | Admitting: Certified Nurse Midwife

## 2017-11-18 ENCOUNTER — Other Ambulatory Visit: Payer: Self-pay

## 2017-11-18 ENCOUNTER — Other Ambulatory Visit (HOSPITAL_COMMUNITY)
Admission: RE | Admit: 2017-11-18 | Discharge: 2017-11-18 | Disposition: A | Discharge status: Home / Self Care | Payer: BLUE CROSS/BLUE SHIELD | Source: Ambulatory Visit | Attending: Obstetrics & Gynecology | Admitting: Obstetrics & Gynecology

## 2017-11-18 ENCOUNTER — Encounter: Payer: Self-pay | Admitting: Certified Nurse Midwife

## 2017-11-18 VITALS — BP 104/60 | HR 62 | Resp 12 | Ht 62.5 in | Wt 118.1 lb

## 2017-11-18 DIAGNOSIS — N952 Postmenopausal atrophic vaginitis: Secondary | ICD-10-CM

## 2017-11-18 DIAGNOSIS — Z01419 Encounter for gynecological examination (general) (routine) without abnormal findings: Secondary | ICD-10-CM

## 2017-11-18 DIAGNOSIS — Z1151 Encounter for screening for human papillomavirus (HPV): Secondary | ICD-10-CM | POA: Insufficient documentation

## 2017-11-18 DIAGNOSIS — Z124 Encounter for screening for malignant neoplasm of cervix: Secondary | ICD-10-CM | POA: Insufficient documentation

## 2017-11-18 MED ORDER — ESTRADIOL 2 MG VA RING: 2.0000 mg | VAGINAL_RING | VAGINAL | 4 refills | Status: DC

## 2017-11-18 NOTE — Patient Instructions (Signed)

## 2017-11-18 NOTE — Progress Notes (Signed)
57 y.o. G28P2002 Married  Caucasian Fe here for annual exam. Estring still working well for vaginal dryness. Had another BV infection several weeks ago,but cleared without issues. Took bike trip in [place] with no UTI occurrence! Denies vaginal bleeding or pain with Estring use. Due for removal today. Needs new Rx for insertion. Sees PCP Dr. Wynelle Link for aex, labs and Dexilant management. No health issues today.  Patient's last menstrual period was 09/28/2008 (exact date).          Sexually active: Yes.    The current method of family planning is post menopausal status.    Exercising: Yes.    run, bike, yoga, pilates, weights, tennis  Smoker:  no  Health Maintenance: Pap:  10-04-14 negative, HR HPV negative  History of Abnormal Pap: no MMG:  08-20-17 BIRADS 1 negative Self Breast exams: no Colonoscopy:  10-25-10 normal, f/u 10 years  BMD:   none TDaP:  2016  Shingles: no Pneumonia: no Hep C and HIV: had done when married  Labs: PCP   reports that she has never smoked. She has never used smokeless tobacco. She reports that she drinks alcohol. She reports that she does not use drugs.  Past Medical History:  Diagnosis Date  . Anemia   . Anxiety    with air travel   . Dysmenorrhea   . H/O renal calculi   . Mastalgia   . Ovarian cyst     Past Surgical History:  Procedure Laterality Date  . COLONOSCOPY  10/25/10   negative recheckin 10 years  . WISDOM TOOTH EXTRACTION      Current Outpatient Medications  Medication Sig Dispense Refill  . DEXILANT 60 MG capsule as needed.  0  . estradiol (ESTRING) 2 MG vaginal ring Place 2 mg vaginally every 3 (three) months. Insert a new ring into vagina every 3 months 1 each 4  . lidocaine (XYLOCAINE) 5 % ointment Apply 1 application topically 4 (four) times daily as needed. 30 g 0  . Probiotic Product (PROBIOTIC PO) Take by mouth.    Marland Kitchen UNABLE TO FIND Allergy Injections once a month     No current facility-administered medications for this visit.      Family History  Problem Relation Age of Onset  . Hypertension Mother   . Thyroid disease Mother   . Heart failure Maternal Grandmother   . Stroke Paternal Grandmother   . Heart failure Paternal Uncle     ROS:  Pertinent items are noted in HPI.  Otherwise, a comprehensive ROS was negative.  Exam:   BP 104/60 (BP Location: Right Arm, Patient Position: Sitting, Cuff Size: Normal)   Pulse 62   Resp 12   Ht 5' 2.5" (1.588 m)   Wt 118 lb 1.6 oz (53.6 kg)   LMP 09/28/2008 (Exact Date)   BMI 21.26 kg/m  Height: 5' 2.5" (158.8 cm) Ht Readings from Last 3 Encounters:  11/18/17 5' 2.5" (1.588 m)  08/13/17 5' 2.75" (1.594 m)  07/29/17 5' 2.75" (1.594 m)    General appearance: alert, cooperative and appears stated age Head: Normocephalic, without obvious abnormality, atraumatic Neck: no adenopathy, supple, symmetrical, trachea midline and thyroid normal to inspection and palpation Lungs: clear to auscultation bilaterally Breasts: normal appearance, no masses or tenderness, No nipple retraction or dimpling, No nipple discharge or bleeding, No axillary or supraclavicular adenopathy Heart: regular rate and rhythm Abdomen: soft, non-tender; no masses,  no organomegaly Extremities: extremities normal, atraumatic, no cyanosis or edema Skin: Skin color, texture,  turgor normal. No rashes or lesions Lymph nodes: Cervical, supraclavicular, and axillary nodes normal. No abnormal inguinal nodes palpated Neurologic: Grossly normal   Pelvic: External genitalia:  no lesions              Urethra:  normal appearing urethra with no masses, tenderness or lesions              Bartholin's and Skene's: normal                 Vagina: normal appearing vagina with normal color and discharge, no lesions              Cervix: no bleeding following Pap, no cervical motion tenderness and no lesions              Pap taken: Yes.   Bimanual Exam:  Uterus:  normal size, contour, position, consistency, mobility,  non-tender and anteverted              Adnexa: normal adnexa and no mass, fullness, tenderness               Rectovaginal: Confirms               Anus:  normal sphincter tone, no lesions  Chaperone present: yes  A:  Well Woman with normal exam  Menopausal no HRT  Atrophic vaginitis with Estring use with good results  History of BV and UTI previously with vaginal dryness  P:   Reviewed health and wellness pertinent to exam  Aware to advise if vaginal bleeding  Discussed risks/benefits/warnings with Estring use. Desires continuation.   Rx Estring see order with instructions ( patient inserts, but comes in for removal)  Stressed importance of emptying bladder well. Discussed Aveeno sitz bath when vaginal irritation occurs or Aveeno Eczema cream use. Questions addressed.  Pap smear: yes   counseled on breast self exam, mammography screening, feminine hygiene, adequate intake of calcium and vitamin D, diet and exercise  return annually or prn  An After Visit Summary was printed and given to the patient.

## 2017-11-20 LAB — CYTOLOGY - PAP
Diagnosis: NEGATIVE
HPV: NOT DETECTED

## 2017-11-30 DIAGNOSIS — J301 Allergic rhinitis due to pollen: Secondary | ICD-10-CM | POA: Diagnosis not present

## 2017-12-22 DIAGNOSIS — F4323 Adjustment disorder with mixed anxiety and depressed mood: Secondary | ICD-10-CM | POA: Diagnosis not present

## 2017-12-28 DIAGNOSIS — F4323 Adjustment disorder with mixed anxiety and depressed mood: Secondary | ICD-10-CM | POA: Diagnosis not present

## 2018-01-11 DIAGNOSIS — J301 Allergic rhinitis due to pollen: Secondary | ICD-10-CM | POA: Diagnosis not present

## 2018-01-12 DIAGNOSIS — F4323 Adjustment disorder with mixed anxiety and depressed mood: Secondary | ICD-10-CM | POA: Diagnosis not present

## 2018-01-20 DIAGNOSIS — F4323 Adjustment disorder with mixed anxiety and depressed mood: Secondary | ICD-10-CM | POA: Diagnosis not present

## 2018-01-21 DIAGNOSIS — M7042 Prepatellar bursitis, left knee: Secondary | ICD-10-CM | POA: Diagnosis not present

## 2018-01-21 DIAGNOSIS — M9903 Segmental and somatic dysfunction of lumbar region: Secondary | ICD-10-CM | POA: Diagnosis not present

## 2018-01-21 DIAGNOSIS — M9905 Segmental and somatic dysfunction of pelvic region: Secondary | ICD-10-CM | POA: Diagnosis not present

## 2018-01-21 DIAGNOSIS — M9902 Segmental and somatic dysfunction of thoracic region: Secondary | ICD-10-CM | POA: Diagnosis not present

## 2018-01-22 DIAGNOSIS — Z23 Encounter for immunization: Secondary | ICD-10-CM | POA: Diagnosis not present

## 2018-01-25 DIAGNOSIS — M545 Low back pain: Secondary | ICD-10-CM | POA: Diagnosis not present

## 2018-01-27 DIAGNOSIS — F4323 Adjustment disorder with mixed anxiety and depressed mood: Secondary | ICD-10-CM | POA: Diagnosis not present

## 2018-02-09 DIAGNOSIS — F4323 Adjustment disorder with mixed anxiety and depressed mood: Secondary | ICD-10-CM | POA: Diagnosis not present

## 2018-02-16 ENCOUNTER — Ambulatory Visit: Payer: BLUE CROSS/BLUE SHIELD | Admitting: Certified Nurse Midwife

## 2018-02-16 ENCOUNTER — Encounter: Payer: Self-pay | Admitting: Certified Nurse Midwife

## 2018-02-16 ENCOUNTER — Other Ambulatory Visit: Payer: Self-pay

## 2018-02-16 VITALS — BP 90/60 | HR 64 | Resp 16 | Wt 119.0 lb

## 2018-02-16 DIAGNOSIS — N952 Postmenopausal atrophic vaginitis: Secondary | ICD-10-CM | POA: Diagnosis not present

## 2018-02-16 NOTE — Patient Instructions (Signed)
Atrophic Vaginitis Atrophic vaginitis is when the tissues that line the vagina become dry and thin. This is caused by a drop in estrogen. Estrogen helps:  To keep the vagina moist.  To make a clear fluid that helps: ? To lubricate the vagina for sex. ? To protect the vagina from infection.  If the lining of the vagina is dry and thin, it may:  Make sex painful. It may also cause bleeding.  Cause a feeling of: ? Burning. ? Irritation. ? Itchiness.  Make an exam of your vagina painful. It may also cause bleeding.  Make you lose interest in sex.  Cause a burning feeling when you pee.  Make your vaginal fluid (discharge) brown or yellow.  For some women, there are no symptoms. This condition is most common in women who do not get their regular menstrual periods anymore (menopause). This often starts when a woman is 45-55 years old. Follow these instructions at home:  Take medicines only as told by your doctor. Do not use any herbal or alternative medicines unless your doctor says it is okay.  Use over-the-counter products for dryness only as told by your doctor. These include: ? Creams. ? Lubricants. ? Moisturizers.  Do not douche.  Do not use products that can make your vagina dry. These include: ? Scented feminine sprays. ? Scented tampons. ? Scented soaps.  If it hurts to have sex, tell your sexual partner. Contact a doctor if:  Your discharge looks different than normal.  Your vagina has an unusual smell.  You have new symptoms.  Your symptoms do not get better with treatment.  Your symptoms get worse. This information is not intended to replace advice given to you by your health care provider. Make sure you discuss any questions you have with your health care provider. Document Released: 10/08/2007 Document Revised: 09/27/2015 Document Reviewed: 04/12/2014 Elsevier Interactive Patient Education  2018 [employer]  

## 2018-02-16 NOTE — Progress Notes (Signed)
Review of Systems  Constitutional: Negative.   HENT: Negative.   Eyes: Negative.   Respiratory: Negative.   Cardiovascular: Negative.   Gastrointestinal: Negative.   Genitourinary: Negative.   Musculoskeletal: Negative.   Skin: Negative.   Neurological: Negative.   Endo/Heme/Allergies: Negative.   Psychiatric/Behavioral: Negative.     57 y.o. Married Caucasian female G2P2002 here for removal of Estring. Working well for vaginal dryness and discomfort. Has new ring at home to insert. She has tried without success in past to remove and prefers to come in for removal. Denies any vaginal issues of bleeding. No health issues today.   O: Healthy WD,WN female Affect: normal, orientation x 3  Skin:warm and dry Abdomen:soft, non tender,no masses Pelvic exam:EXTERNAL GENITALIA: normal appearing vulva with no masses, tenderness or lesions VAGINA: no abnormal discharge or lesions and normal appearance with moisture present. Estring palpated in place, removed gently and discarded after showing patient. CERVIX: no lesions or cervical motion tenderness UTERUS: norma, non tender, no masses ante-verted ADNEXA: no masses palpable and nontender RECTUM: normal appearance  A:Atrophic vaginitis with Estring use with good results Removed Estring with normal pelvic exam noted   P: Discussed findings of normal exam and good results with Estring use. Patient to insert new ring at home.  Will schedule 3 month appointment for next removal. Warning signs with use reviewed.  Rv prn, as above

## 2018-02-22 DIAGNOSIS — J301 Allergic rhinitis due to pollen: Secondary | ICD-10-CM | POA: Diagnosis not present

## 2018-02-23 DIAGNOSIS — F4323 Adjustment disorder with mixed anxiety and depressed mood: Secondary | ICD-10-CM | POA: Diagnosis not present

## 2018-03-04 DIAGNOSIS — M9903 Segmental and somatic dysfunction of lumbar region: Secondary | ICD-10-CM | POA: Diagnosis not present

## 2018-03-04 DIAGNOSIS — M9905 Segmental and somatic dysfunction of pelvic region: Secondary | ICD-10-CM | POA: Diagnosis not present

## 2018-03-04 DIAGNOSIS — M9902 Segmental and somatic dysfunction of thoracic region: Secondary | ICD-10-CM | POA: Diagnosis not present

## 2018-03-04 DIAGNOSIS — M7042 Prepatellar bursitis, left knee: Secondary | ICD-10-CM | POA: Diagnosis not present

## 2018-03-09 DIAGNOSIS — F4323 Adjustment disorder with mixed anxiety and depressed mood: Secondary | ICD-10-CM | POA: Diagnosis not present

## 2018-03-10 DIAGNOSIS — Z23 Encounter for immunization: Secondary | ICD-10-CM | POA: Diagnosis not present

## 2018-03-24 ENCOUNTER — Emergency Department (HOSPITAL_COMMUNITY): Payer: BLUE CROSS/BLUE SHIELD

## 2018-03-24 ENCOUNTER — Other Ambulatory Visit: Payer: Self-pay

## 2018-03-24 ENCOUNTER — Emergency Department (HOSPITAL_COMMUNITY)
Admission: EM | Admit: 2018-03-24 | Discharge: 2018-03-24 | Disposition: A | Discharge status: Home / Self Care | Payer: BLUE CROSS/BLUE SHIELD | Attending: Emergency Medicine | Admitting: Emergency Medicine

## 2018-03-24 ENCOUNTER — Encounter (HOSPITAL_COMMUNITY): Payer: Self-pay | Admitting: Emergency Medicine

## 2018-03-24 DIAGNOSIS — S52611A Displaced fracture of right ulna styloid process, initial encounter for closed fracture: Secondary | ICD-10-CM | POA: Diagnosis not present

## 2018-03-24 DIAGNOSIS — Y92002 Bathroom of unspecified non-institutional (private) residence single-family (private) house as the place of occurrence of the external cause: Secondary | ICD-10-CM | POA: Insufficient documentation

## 2018-03-24 DIAGNOSIS — Y93E1 Activity, personal bathing and showering: Secondary | ICD-10-CM | POA: Insufficient documentation

## 2018-03-24 DIAGNOSIS — Y999 Unspecified external cause status: Secondary | ICD-10-CM | POA: Insufficient documentation

## 2018-03-24 DIAGNOSIS — R52 Pain, unspecified: Secondary | ICD-10-CM

## 2018-03-24 DIAGNOSIS — W182XXA Fall in (into) shower or empty bathtub, initial encounter: Secondary | ICD-10-CM | POA: Insufficient documentation

## 2018-03-24 DIAGNOSIS — S52501A Unspecified fracture of the lower end of right radius, initial encounter for closed fracture: Secondary | ICD-10-CM | POA: Insufficient documentation

## 2018-03-24 DIAGNOSIS — W19XXXA Unspecified fall, initial encounter: Secondary | ICD-10-CM

## 2018-03-24 DIAGNOSIS — S59291A Other physeal fracture of lower end of radius, right arm, initial encounter for closed fracture: Secondary | ICD-10-CM | POA: Diagnosis not present

## 2018-03-24 MED ORDER — LIDOCAINE HCL (PF) 1 % IJ SOLN
10.0000 mL | Freq: Once | INTRAMUSCULAR | Status: AC
  Administered 2018-03-24: 10 mL
  Filled 2018-03-24: qty 10

## 2018-03-24 MED ORDER — MORPHINE SULFATE (PF) 2 MG/ML IV SOLN
2.0000 mg | Freq: Once | INTRAVENOUS | Status: AC
  Administered 2018-03-24: 2 mg via INTRAVENOUS
  Filled 2018-03-24: qty 1

## 2018-03-24 MED ORDER — HYDROCODONE-ACETAMINOPHEN 5-325 MG PO TABS
1.0000 | ORAL_TABLET | Freq: Once | ORAL | Status: AC
  Administered 2018-03-24: 1 via ORAL
  Filled 2018-03-24: qty 1

## 2018-03-24 MED ORDER — TRAMADOL HCL 50 MG PO TABS: 50.0000 mg | ORAL_TABLET | Freq: Four times a day (QID) | ORAL | 0 refills | Status: AC | PRN

## 2018-03-24 MED ORDER — FENTANYL CITRATE (PF) 100 MCG/2ML IJ SOLN
25.0000 ug | Freq: Once | INTRAMUSCULAR | Status: AC
  Administered 2018-03-24: 25 ug via INTRAVENOUS
  Filled 2018-03-24: qty 2

## 2018-03-24 MED ORDER — OXYCODONE-ACETAMINOPHEN 5-325 MG PO TABS: 1.0000 | ORAL_TABLET | ORAL | 0 refills | Status: AC | PRN

## 2018-03-24 MED ORDER — FENTANYL CITRATE (PF) 100 MCG/2ML IJ SOLN
25.0000 ug | Freq: Once | INTRAMUSCULAR | Status: DC
  Filled 2018-03-24: qty 2

## 2018-03-24 NOTE — Progress Notes (Signed)
Orthopedic Tech Progress Note Patient Details:  Wendy FaceCarmella C Medina 03/17/1961 [id number]  Ortho Devices Type of Ortho Device: Arm sling, Sugartong splint, Finger trap Finger Trap Weight: 5 Ortho Device/Splint Location: rue Ortho Device/Splint Interventions: Ordered, Application, Adjustment   Post Interventions Patient Tolerated: Well Instructions Provided: Care of device, Adjustment of device   [name], Aaleeyah Bias J 03/24/2018, 7:01 PM

## 2018-03-24 NOTE — ED Notes (Signed)
Patient transported to X-ray 

## 2018-03-24 NOTE — ED Provider Notes (Signed)
Medical screening examination/treatment/procedure(s) were conducted as a shared visit with non-physician practitioner(s) and myself.  I personally evaluated the patient during the encounter. Briefly, the patient is a 57 y.o. female with no significant medical history presents the ED after mechanical fall in the shower.  Patient with deformity to the right wrist.  Patient is neurovascularly intact on exam.  Suspect likely wrist fracture.  Has good movement in her fingers.  No concern for nerve entrapment.  X-ray show comminuted right wrist fracture.  Possibly ulnar styloid fracture as well.  Hematoma block was performed and manual reduction aided with finger traps was performed and overall improvement of angulation following repeat x-ray.  Dr. Orlan Leavensrtman with hand surgery was consulted and will follow-up outpatient.  Patient given IV morphine while in the ED.  Will be given prescription for narcotics.  Educated about narcotic use.  Was neurovascularly intact after reduction and splinting.  Given return precautions.  Recommend follow-up with hand surgery as will likely need surgery.  This chart was dictated using voice recognition software.  Despite best efforts to proofread,  errors can occur which can change the documentation meaning.  Celedonio Miyamoto.Nerve Block Date/Time: 03/24/2018 5:50 PM Performed by: Virgina Norfolkuratolo, Deejay Koppelman, DO Authorized by: Virgina Norfolkuratolo, Maelee Hoot, DO   Consent:    Consent obtained:  Verbal   Consent given by:  Patient   Risks discussed:  Allergic reaction, bleeding, infection, intravenous injection, nerve damage, pain, swelling and unsuccessful block   Alternatives discussed:  No treatment Indications:    Indications:  Pain relief and procedural anesthesia Location:    Body area:  Upper extremity   Upper extremity nerve blocked: right wrist hematoma block.   Laterality:  Right Pre-procedure details:    Skin preparation:  2% chlorhexidine   Preparation: Patient was prepped and draped in usual sterile  fashion   Skin anesthesia (see MAR for exact dosages):    Skin anesthesia method:  Local infiltration   Local anesthetic:  Lidocaine 1% w/o epi Procedure details (see MAR for exact dosages):    Block needle gauge:  25 G   Injection procedure:  Anatomic landmarks identified, incremental injection, introduced needle and anatomic landmarks palpated   Paresthesia:  Immediately resolved Post-procedure details:    Outcome:  Pain relieved   Patient tolerance of procedure:  Tolerated well, no immediate complications .Ortho Injury Treatment Date/Time: 03/24/2018 5:51 PM Performed by: Virgina Norfolkuratolo, Gearlene Godsil, DO Authorized by: Virgina Norfolkuratolo, Cady Hafen, DO   Consent:    Consent obtained:  Verbal   Consent given by:  Patient   Risks discussed:  Fracture, irreducible dislocation, nerve damage, recurrent dislocation, restricted joint movement, stiffness and vascular damage   Alternatives discussed:  No treatmentInjury location: wrist Location details: right wrist Injury type: fracture Fracture type: distal radius and ulnar styloid Pre-procedure neurovascular assessment: neurovascularly intact Pre-procedure distal perfusion: normal Pre-procedure neurological function: normal Pre-procedure range of motion: reduced Anesthesia: local infiltration  Anesthesia: Local anesthesia used: yes Local Anesthetic: lidocaine 1% without epinephrine Anesthetic total: 6 mL  Patient sedated: NoManipulation performed: yes Skeletal traction used: yes (finger traps and manual reduction ) Reduction successful: yes X-ray confirmed reduction: yes Immobilization: splint Splint type: sugar tong Post-procedure neurovascular assessment: post-procedure neurovascularly intact Post-procedure distal perfusion: normal Post-procedure neurological function: normal Post-procedure range of motion comment: immbolized  Patient tolerance: Patient tolerated the procedure well with no immediate complications        EKG  Interpretation None           Virgina NorfolkCuratolo, Walterine Amodei, DO 03/24/18 1840

## 2018-03-24 NOTE — ED Provider Notes (Addendum)
[facility] EMERGENCY DEPARTMENT Provider Note   CSN: [id number] Arrival date & time: 03/24/18  1506     History   Chief Complaint Chief Complaint  Patient presents with  . Fall    HPI Wendy Medina is a 57 y.o. female.  57 y/o female with a PMH of Anemia, Anxiety presents to the ED s/p fall in the shower x 30 minutes ago.Patient was taking a shower when she saw a bug in the shower and got scared causing her to fall and brace herself with the right hand. She apply some ice to the area and drove to the ED. She reports pain along the right wrist worse with movement. She has not tried any therapy for relieve. Patient denies any back pain, myalgias, or hitting her head.      Past Medical History:  Diagnosis Date  . Anemia   . Anxiety    with air travel   . Dysmenorrhea   . H/O renal calculi   . Mastalgia   . Ovarian cyst     Patient Active Problem List   Diagnosis Date Noted  . Other pancytopenia (HCC) 11/09/2014  . Postmenopausal atrophic vaginitis 09/22/2013    Past Surgical History:  Procedure Laterality Date  . COLONOSCOPY  10/25/10   negative recheckin 10 years  . WISDOM TOOTH EXTRACTION       OB History    Gravida  2   Para  2   Term  2   Preterm  0   AB  0   Living  2     SAB  0   TAB  0   Ectopic  0   Multiple  0   Live Births  2            Home Medications    Prior to Admission medications   Medication Sig Start Date End Date Taking? Authorizing Provider  DEXILANT 60 MG capsule as needed. 04/01/17   [provider]  estradiol (ESTRING) 2 MG vaginal ring Place 2 mg vaginally every 3 (three) months. Insert a new ring into vagina every 3 months 11/18/17   Verner Chol, CNM  oxyCODONE-acetaminophen (PERCOCET/ROXICET) 5-325 MG tablet Take 1 tablet by mouth every 4 (four) hours as needed for up to 3 days for severe pain. 03/24/18 03/27/18  Claude Manges, PA-C  Probiotic Product (PROBIOTIC PO) Take  by mouth.    [provider]  UNABLE TO FIND Allergy Injections once a month    [provider]    Family History Family History  Problem Relation Age of Onset  . Hypertension Mother   . Thyroid disease Mother   . Heart failure Maternal Grandmother   . Stroke Paternal Grandmother   . Heart failure Paternal Uncle     Social History Social History   Tobacco Use  . Smoking status: Never Smoker  . Smokeless tobacco: Never Used  Substance Use Topics  . Alcohol use: Yes    Alcohol/week: 0.0 - 1.0 standard drinks    Comment: rarely  . Drug use: No     Allergies   Corn-containing products and Sulfa antibiotics   Review of Systems Review of Systems  Constitutional: Negative for fever.  HENT: Negative for sore throat.   Respiratory: Negative for shortness of breath.   Cardiovascular: Negative for chest pain.  Gastrointestinal: Negative for abdominal pain, diarrhea, nausea and vomiting.  Genitourinary: Negative for dyspareunia.  Musculoskeletal: Positive for arthralgias and joint swelling.  Neurological: Negative for headaches.     Physical Exam Updated Vital Signs BP (!) 93/54 (BP Location: Left Arm)   Pulse 66   Temp 99 F (37.2 C) (Oral)   Resp 14   LMP 09/28/2008 (Exact Date)   SpO2 98%   Physical Exam  Constitutional: She is oriented to person, place, and time. She appears well-developed and well-nourished. No distress.  HENT:  Head: Normocephalic and atraumatic.  Mouth/Throat: Oropharynx is clear and moist. No oropharyngeal exudate.  Eyes: Pupils are equal, round, and reactive to light.  Neck: Normal range of motion.  Cardiovascular: Regular rhythm and normal heart sounds.  Pulmonary/Chest: Effort normal and breath sounds normal. No respiratory distress.  Abdominal: Soft. Bowel sounds are normal. She exhibits no distension. There is no tenderness.  Musculoskeletal:       Right shoulder: Normal. She exhibits normal range of motion and no  tenderness.       Right elbow: Normal.She exhibits normal range of motion and no swelling.       Right wrist: She exhibits decreased range of motion, tenderness, swelling and deformity. She exhibits no laceration.       Right lower leg: She exhibits no edema.       Left lower leg: She exhibits no edema.  Pulses present, capillary refill is intact.Medial, ulnar, radial nerve tested and intact.   Neurological: She is alert and oriented to person, place, and time.  Skin: Skin is warm and dry.  Psychiatric: She has a normal mood and affect.  Nursing note and vitals reviewed.    ED Treatments / Results  Labs (all labs ordered are listed, but only abnormal results are displayed) Labs Reviewed - No data to display  EKG None  Radiology Dg Wrist 2 Views Right  Result Date: 03/24/2018 CLINICAL DATA:  Post reduction of right wrist fracture. EXAM: RIGHT WRIST - 2 VIEW COMPARISON:  Earlier same day radiographs of the right wrist. FINDINGS: Patient has undergone reduction of distal radial metaphyseal fracture. There is now 22 degrees of dorsal angulation of the distal fracture fragment, previously 46 degrees. Ulnar styloid fracture is demonstrated. Carpal rows are maintained with mild osteoarthritic joint space narrowing of the triscaphe articulation. Overlying fiberglass cast limits fine bony detail. IMPRESSION: 1. Reduction of distal radial metaphyseal fracture with 22 degrees of dorsal angulation of the distal fracture fragment. 2. Ulnar styloid fracture as suspected. Electronically Signed   By: Tollie Eth M.D.   On: 03/24/2018 18:41   Dg Wrist Complete Right  Result Date: 03/24/2018 CLINICAL DATA:  Fall in shower, wrist deformity. EXAM: RIGHT WRIST - COMPLETE 3+ VIEW COMPARISON:  None. FINDINGS: Distal radial transverse metaphyseal fracture with 46 degrees of apex anterior angulation. Probable extension into the distal radial articular surface. Mild comminution. Ossicle along the ulnar styloid  favoring age-indeterminate fracture. IMPRESSION: 1. Distal radial metaphyseal fracture with 46 degrees apex anterior angulation and probable extension to the distal radial articular surface along with mild comminution. 2. Suspected fracture of the ulnar styloid. Electronically Signed   By: Gaylyn Rong M.D.   On: 03/24/2018 16:08   Dg Hand Complete Right  Result Date: 03/24/2018 CLINICAL DATA:  Wrist fracture.  Fall in shower today. EXAM: RIGHT HAND - COMPLETE 3+ VIEW COMPARISON:  None. FINDINGS: The distal radial fracture with apex posterior angulation is again observed. There is potentially up to 6 mm of posterior dislocation of the distal fracture fragment, potentially representing a Barton fracture component. Ossicle along the ulnar styloid favors  a prior age indeterminate fracture. No metacarpal or phalangeal fracture is identified. No definite fracture of the carpal bones. IMPRESSION: 1. Dorsal Barton fracture with both angulation and posterior displacement of the distal radial fracture fragment. 2. Age-indeterminate fracture of the ulnar styloid. Electronically Signed   By: Gaylyn RongWalter  Liebkemann M.D.   On: 03/24/2018 16:10    Procedures Procedures (including critical care time)  Medications Ordered in ED Medications  fentaNYL (SUBLIMAZE) injection 25 mcg (25 mcg Intravenous Given 03/24/18 1539)  lidocaine (PF) (XYLOCAINE) 1 % injection 10 mL (10 mLs Infiltration Given 03/24/18 1624)  morphine 2 MG/ML injection 2 mg (2 mg Intravenous Given 03/24/18 1650)     Initial Impression / Assessment and Plan / ED Course  I have reviewed the triage vital signs and the nursing notes.  Pertinent labs & imaging results that were available during my care of the patient were reviewed by me and considered in my medical decision making (see chart for details).    Patient presents with obvious right wrist deformity. DG right wrist showed:  1. Distal radial metaphyseal fracture with 46 degrees apex  anterior  angulation and probable extension to the distal radial articular  surface along with mild comminution.  2. Suspected fracture of the ulnar styloid.   5:11 PM Spoke to Dr. Orlan Leavensrtman who is currently in surgery, he reports he will call patient but he is unable to perform hematoma block at this time due to being busy in the OR. Will have Dr. Lockie Molauratolo perform a hematoma block along with reduction, patient to be placed on finger traps.   Patient was provided with IV fentanyl, IV morphine to help with her pain.  I will also send patient with a prescription for Percocet in order to treat her pain.  Patient is advised to follow-up with Dr. Orlan Leavensrtman hand specialist.   Postreduction x-ray showed 1. Reduction of distal radial metaphyseal fracture with 22 degrees  of dorsal angulation of the distal fracture fragment.  2. Ulnar styloid fracture as suspected.     Patient is to be discharged on narcotics, I have explained to patient about the usage of these.  Patient understand and agrees with plan.  Husband at the bedside stable for discharge. Final Clinical Impressions(s) / ED Diagnoses   Final diagnoses:  Fall, initial encounter  Closed fracture of distal end of right radius, unspecified fracture morphology, initial encounter  Closed displaced fracture of styloid process of right ulna, initial encounter    ED Discharge Orders         Ordered    oxyCODONE-acetaminophen (PERCOCET/ROXICET) 5-325 MG tablet  Every 4 hours PRN     03/24/18 1823           Claude MangesSoto, Uzma Hellmer, PA-C 03/24/18 1851    Claude MangesSoto, Chaston Bradburn, PA-C 03/24/18 1852    Virgina Norfolkuratolo, Adam, DO 03/24/18 1925

## 2018-03-24 NOTE — ED Triage Notes (Signed)
Pt reports she slipped and fell in the shower. Pt attempted to brace her self with RUE. Pt has obvious deformity to R wrist and forearm. PMS intact. No other injuries noted.

## 2018-03-24 NOTE — ED Notes (Signed)
Patient verbalizes understanding of discharge instructions. Opportunity for questioning and answers were provided. Armband removed by staff, pt discharged from ED ambulatory.   

## 2018-03-25 DIAGNOSIS — M25531 Pain in right wrist: Secondary | ICD-10-CM | POA: Diagnosis not present

## 2018-03-25 DIAGNOSIS — S52561A Barton's fracture of right radius, initial encounter for closed fracture: Secondary | ICD-10-CM | POA: Diagnosis not present

## 2018-03-25 DIAGNOSIS — S52509A Unspecified fracture of the lower end of unspecified radius, initial encounter for closed fracture: Secondary | ICD-10-CM | POA: Insufficient documentation

## 2018-03-26 DIAGNOSIS — G8918 Other acute postprocedural pain: Secondary | ICD-10-CM | POA: Diagnosis not present

## 2018-03-26 DIAGNOSIS — S52571A Other intraarticular fracture of lower end of right radius, initial encounter for closed fracture: Secondary | ICD-10-CM | POA: Diagnosis not present

## 2018-03-26 DIAGNOSIS — S52561A Barton's fracture of right radius, initial encounter for closed fracture: Secondary | ICD-10-CM | POA: Diagnosis not present

## 2018-03-26 DIAGNOSIS — Y999 Unspecified external cause status: Secondary | ICD-10-CM | POA: Diagnosis not present

## 2018-04-06 DIAGNOSIS — J301 Allergic rhinitis due to pollen: Secondary | ICD-10-CM | POA: Diagnosis not present

## 2018-04-07 DIAGNOSIS — F4323 Adjustment disorder with mixed anxiety and depressed mood: Secondary | ICD-10-CM | POA: Diagnosis not present

## 2018-04-08 DIAGNOSIS — S52561D Barton's fracture of right radius, subsequent encounter for closed fracture with routine healing: Secondary | ICD-10-CM | POA: Diagnosis not present

## 2018-04-10 DIAGNOSIS — M9902 Segmental and somatic dysfunction of thoracic region: Secondary | ICD-10-CM | POA: Diagnosis not present

## 2018-04-10 DIAGNOSIS — M9905 Segmental and somatic dysfunction of pelvic region: Secondary | ICD-10-CM | POA: Diagnosis not present

## 2018-04-10 DIAGNOSIS — M7042 Prepatellar bursitis, left knee: Secondary | ICD-10-CM | POA: Diagnosis not present

## 2018-04-10 DIAGNOSIS — M9903 Segmental and somatic dysfunction of lumbar region: Secondary | ICD-10-CM | POA: Diagnosis not present

## 2018-04-13 DIAGNOSIS — J301 Allergic rhinitis due to pollen: Secondary | ICD-10-CM | POA: Diagnosis not present

## 2018-04-14 DIAGNOSIS — Z23 Encounter for immunization: Secondary | ICD-10-CM | POA: Diagnosis not present

## 2018-04-19 DIAGNOSIS — M9905 Segmental and somatic dysfunction of pelvic region: Secondary | ICD-10-CM | POA: Diagnosis not present

## 2018-04-19 DIAGNOSIS — M7042 Prepatellar bursitis, left knee: Secondary | ICD-10-CM | POA: Diagnosis not present

## 2018-04-19 DIAGNOSIS — M9902 Segmental and somatic dysfunction of thoracic region: Secondary | ICD-10-CM | POA: Diagnosis not present

## 2018-04-19 DIAGNOSIS — M9903 Segmental and somatic dysfunction of lumbar region: Secondary | ICD-10-CM | POA: Diagnosis not present

## 2018-04-20 DIAGNOSIS — F4323 Adjustment disorder with mixed anxiety and depressed mood: Secondary | ICD-10-CM | POA: Diagnosis not present

## 2018-04-22 DIAGNOSIS — S52561D Barton's fracture of right radius, subsequent encounter for closed fracture with routine healing: Secondary | ICD-10-CM | POA: Diagnosis not present

## 2018-04-22 DIAGNOSIS — M25531 Pain in right wrist: Secondary | ICD-10-CM | POA: Diagnosis not present

## 2018-04-29 DIAGNOSIS — M25531 Pain in right wrist: Secondary | ICD-10-CM | POA: Diagnosis not present

## 2018-05-17 DIAGNOSIS — M25531 Pain in right wrist: Secondary | ICD-10-CM | POA: Diagnosis not present

## 2018-05-18 DIAGNOSIS — M9905 Segmental and somatic dysfunction of pelvic region: Secondary | ICD-10-CM | POA: Diagnosis not present

## 2018-05-18 DIAGNOSIS — M9902 Segmental and somatic dysfunction of thoracic region: Secondary | ICD-10-CM | POA: Diagnosis not present

## 2018-05-18 DIAGNOSIS — F4323 Adjustment disorder with mixed anxiety and depressed mood: Secondary | ICD-10-CM | POA: Diagnosis not present

## 2018-05-18 DIAGNOSIS — M7042 Prepatellar bursitis, left knee: Secondary | ICD-10-CM | POA: Diagnosis not present

## 2018-05-18 DIAGNOSIS — M9903 Segmental and somatic dysfunction of lumbar region: Secondary | ICD-10-CM | POA: Diagnosis not present

## 2018-05-19 ENCOUNTER — Ambulatory Visit (INDEPENDENT_AMBULATORY_CARE_PROVIDER_SITE_OTHER): Payer: BLUE CROSS/BLUE SHIELD | Admitting: Certified Nurse Midwife

## 2018-05-19 ENCOUNTER — Other Ambulatory Visit: Payer: Self-pay

## 2018-05-19 ENCOUNTER — Encounter: Payer: Self-pay | Admitting: Certified Nurse Midwife

## 2018-05-19 VITALS — BP 114/64 | HR 64 | Resp 16 | Wt 116.0 lb

## 2018-05-19 DIAGNOSIS — N952 Postmenopausal atrophic vaginitis: Secondary | ICD-10-CM

## 2018-05-19 NOTE — Progress Notes (Signed)
  Subjective:     Patient ID: Wendy Medina, female   DOB: 1961/03/15, 58 y.o.   MRN: [id number]  58 yo gopo married white female here for removal of Estring used for atrophic vaginitis. Denies any problems with use, just unable to remove. Inserts without difficulty. Recent fall with right arm fracture and surgery. Still in arm brace and under follow up with Orthopedic. Denies vaginal bleeding or vaginal issues. Estring working well.    Review of Systems  Constitutional: Negative.   HENT: Negative.   Eyes: Negative.   Respiratory: Negative.   Cardiovascular: Negative.   Gastrointestinal: Negative.   Endocrine: Negative.   Genitourinary: Negative for difficulty urinating, dyspareunia, dysuria, genital sores and pelvic pain.  Musculoskeletal:       Right arm fracture  Skin: Negative.   Allergic/Immunologic: Negative.   Neurological: Negative.   Hematological: Negative.   Psychiatric/Behavioral: Negative.        Objective:   Physical Exam Exam conducted with a chaperone present.  Constitutional:      Appearance: Normal appearance.  Genitourinary:    General: Normal vulva.     Exam position: Lithotomy position.     Pubic Area: No rash.      Labia:        Right: No rash, tenderness or lesion.        Left: No rash, tenderness or lesion.      Urethra: No urethral lesion.     Vagina: No vaginal discharge, tenderness or lesions.     Cervix: No cervical bleeding.     Uterus: Normal.      Adnexa: Right adnexa normal and left adnexa normal.       Right: No mass, tenderness or fullness.         Left: No mass, tenderness or fullness.       Rectum: Normal.     Comments: No vaginal excoriation, good hydration from Estring. Estring palpated and removed with out difficulty. Lymphadenopathy:     Lower Body: No right inguinal adenopathy. No left inguinal adenopathy.  Skin:    General: Skin is warm and dry.  Neurological:     Mental Status: She is alert and oriented to person,  place, and time.  Psychiatric:        Mood and Affect: Mood normal.        Behavior: Behavior normal.        Thought Content: Thought content normal.        Judgment: Judgment normal.        Assessment:     Atrophic vaginitis with estring use with good results. Normal pelvic exam    Plan:     Discussed finding of normal exam and instructed to insert new Estring once home. Patient does not feel she will have any issues with insertion. Aware to advise if vaginal bleeding.  Rv 3 months

## 2018-05-20 DIAGNOSIS — J301 Allergic rhinitis due to pollen: Secondary | ICD-10-CM | POA: Diagnosis not present

## 2018-05-24 DIAGNOSIS — J301 Allergic rhinitis due to pollen: Secondary | ICD-10-CM | POA: Diagnosis not present

## 2018-05-26 DIAGNOSIS — J301 Allergic rhinitis due to pollen: Secondary | ICD-10-CM | POA: Diagnosis not present

## 2018-05-27 DIAGNOSIS — M25531 Pain in right wrist: Secondary | ICD-10-CM | POA: Diagnosis not present

## 2018-05-27 DIAGNOSIS — S52561D Barton's fracture of right radius, subsequent encounter for closed fracture with routine healing: Secondary | ICD-10-CM | POA: Diagnosis not present

## 2018-05-28 DIAGNOSIS — F4323 Adjustment disorder with mixed anxiety and depressed mood: Secondary | ICD-10-CM | POA: Diagnosis not present

## 2018-06-01 DIAGNOSIS — J301 Allergic rhinitis due to pollen: Secondary | ICD-10-CM | POA: Diagnosis not present

## 2018-06-03 DIAGNOSIS — J301 Allergic rhinitis due to pollen: Secondary | ICD-10-CM | POA: Diagnosis not present

## 2018-06-09 DIAGNOSIS — M9903 Segmental and somatic dysfunction of lumbar region: Secondary | ICD-10-CM | POA: Diagnosis not present

## 2018-06-09 DIAGNOSIS — M7042 Prepatellar bursitis, left knee: Secondary | ICD-10-CM | POA: Diagnosis not present

## 2018-06-09 DIAGNOSIS — M9902 Segmental and somatic dysfunction of thoracic region: Secondary | ICD-10-CM | POA: Diagnosis not present

## 2018-06-09 DIAGNOSIS — M9905 Segmental and somatic dysfunction of pelvic region: Secondary | ICD-10-CM | POA: Diagnosis not present

## 2018-06-10 DIAGNOSIS — M25531 Pain in right wrist: Secondary | ICD-10-CM | POA: Diagnosis not present

## 2018-06-10 DIAGNOSIS — F4323 Adjustment disorder with mixed anxiety and depressed mood: Secondary | ICD-10-CM | POA: Diagnosis not present

## 2018-06-17 DIAGNOSIS — M9902 Segmental and somatic dysfunction of thoracic region: Secondary | ICD-10-CM | POA: Diagnosis not present

## 2018-06-17 DIAGNOSIS — M9903 Segmental and somatic dysfunction of lumbar region: Secondary | ICD-10-CM | POA: Diagnosis not present

## 2018-06-17 DIAGNOSIS — M7042 Prepatellar bursitis, left knee: Secondary | ICD-10-CM | POA: Diagnosis not present

## 2018-06-17 DIAGNOSIS — M9905 Segmental and somatic dysfunction of pelvic region: Secondary | ICD-10-CM | POA: Diagnosis not present

## 2018-06-25 DIAGNOSIS — K219 Gastro-esophageal reflux disease without esophagitis: Secondary | ICD-10-CM | POA: Diagnosis not present

## 2018-06-29 DIAGNOSIS — S52561D Barton's fracture of right radius, subsequent encounter for closed fracture with routine healing: Secondary | ICD-10-CM | POA: Diagnosis not present

## 2018-06-30 DIAGNOSIS — F4323 Adjustment disorder with mixed anxiety and depressed mood: Secondary | ICD-10-CM | POA: Diagnosis not present

## 2018-07-05 DIAGNOSIS — K219 Gastro-esophageal reflux disease without esophagitis: Secondary | ICD-10-CM | POA: Diagnosis not present

## 2018-07-06 DIAGNOSIS — H2513 Age-related nuclear cataract, bilateral: Secondary | ICD-10-CM | POA: Diagnosis not present

## 2018-07-06 DIAGNOSIS — H11153 Pinguecula, bilateral: Secondary | ICD-10-CM | POA: Diagnosis not present

## 2018-07-06 DIAGNOSIS — H43811 Vitreous degeneration, right eye: Secondary | ICD-10-CM | POA: Diagnosis not present

## 2018-07-07 ENCOUNTER — Other Ambulatory Visit: Payer: Self-pay | Admitting: Orthopedic Surgery

## 2018-07-07 DIAGNOSIS — R5381 Other malaise: Secondary | ICD-10-CM

## 2018-07-12 DIAGNOSIS — M9905 Segmental and somatic dysfunction of pelvic region: Secondary | ICD-10-CM | POA: Diagnosis not present

## 2018-07-12 DIAGNOSIS — M9903 Segmental and somatic dysfunction of lumbar region: Secondary | ICD-10-CM | POA: Diagnosis not present

## 2018-07-12 DIAGNOSIS — M7042 Prepatellar bursitis, left knee: Secondary | ICD-10-CM | POA: Diagnosis not present

## 2018-07-12 DIAGNOSIS — J301 Allergic rhinitis due to pollen: Secondary | ICD-10-CM | POA: Diagnosis not present

## 2018-07-12 DIAGNOSIS — M9902 Segmental and somatic dysfunction of thoracic region: Secondary | ICD-10-CM | POA: Diagnosis not present

## 2018-07-13 DIAGNOSIS — R05 Cough: Secondary | ICD-10-CM | POA: Diagnosis not present

## 2018-07-13 DIAGNOSIS — K219 Gastro-esophageal reflux disease without esophagitis: Secondary | ICD-10-CM | POA: Diagnosis not present

## 2018-07-13 DIAGNOSIS — R1310 Dysphagia, unspecified: Secondary | ICD-10-CM | POA: Diagnosis not present

## 2018-07-14 ENCOUNTER — Encounter: Payer: Self-pay | Admitting: Certified Nurse Midwife

## 2018-07-14 DIAGNOSIS — R1013 Epigastric pain: Secondary | ICD-10-CM | POA: Diagnosis not present

## 2018-07-14 DIAGNOSIS — K219 Gastro-esophageal reflux disease without esophagitis: Secondary | ICD-10-CM | POA: Diagnosis not present

## 2018-07-14 DIAGNOSIS — R1310 Dysphagia, unspecified: Secondary | ICD-10-CM | POA: Diagnosis not present

## 2018-07-14 DIAGNOSIS — R05 Cough: Secondary | ICD-10-CM | POA: Diagnosis not present

## 2018-07-15 DIAGNOSIS — F4323 Adjustment disorder with mixed anxiety and depressed mood: Secondary | ICD-10-CM | POA: Diagnosis not present

## 2018-07-19 DIAGNOSIS — J301 Allergic rhinitis due to pollen: Secondary | ICD-10-CM | POA: Diagnosis not present

## 2018-07-30 DIAGNOSIS — J019 Acute sinusitis, unspecified: Secondary | ICD-10-CM | POA: Diagnosis not present

## 2018-07-30 DIAGNOSIS — Z91018 Allergy to other foods: Secondary | ICD-10-CM | POA: Diagnosis not present

## 2018-07-30 DIAGNOSIS — J301 Allergic rhinitis due to pollen: Secondary | ICD-10-CM | POA: Diagnosis not present

## 2018-07-30 DIAGNOSIS — K219 Gastro-esophageal reflux disease without esophagitis: Secondary | ICD-10-CM | POA: Diagnosis not present

## 2018-08-04 DIAGNOSIS — F4323 Adjustment disorder with mixed anxiety and depressed mood: Secondary | ICD-10-CM | POA: Diagnosis not present

## 2018-08-10 ENCOUNTER — Encounter: Payer: Self-pay | Admitting: Obstetrics and Gynecology

## 2018-08-10 ENCOUNTER — Other Ambulatory Visit: Payer: Self-pay

## 2018-08-10 ENCOUNTER — Ambulatory Visit (INDEPENDENT_AMBULATORY_CARE_PROVIDER_SITE_OTHER): Payer: BLUE CROSS/BLUE SHIELD | Admitting: Obstetrics and Gynecology

## 2018-08-10 VITALS — BP 110/70 | HR 68 | Temp 98.1°F | Resp 16 | Wt 118.0 lb

## 2018-08-10 DIAGNOSIS — N952 Postmenopausal atrophic vaginitis: Secondary | ICD-10-CM | POA: Diagnosis not present

## 2018-08-10 DIAGNOSIS — F418 Other specified anxiety disorders: Secondary | ICD-10-CM

## 2018-08-10 DIAGNOSIS — N898 Other specified noninflammatory disorders of vagina: Secondary | ICD-10-CM | POA: Diagnosis not present

## 2018-08-10 NOTE — Progress Notes (Signed)
GYNECOLOGY  VISIT   HPI: 58 y.o.   Married Declined Not Hispanic or Latino  female   [phone] with Patient's last menstrual period was 09/28/2008 (exact date).   here for estring change. She is never able to get it out. She feels mild irritation for the last 1-2 weeks ago. Not sure if it's dryness. No increase in vaginal discharge, no vaginal odor. No itching.  She is under stress, husband with Parkinson's. He is unable to get his normal therapy secondary to Covid 19. She does feel she is depressed, is talking to a therapist. Declines medication. No suicidal or homicidal thoughts     GYNECOLOGIC HISTORY: Patient's last menstrual period was 09/28/2008 (exact date). Contraception:post menopausal Menopausal hormone therapy: estring        OB History    Gravida  2   Para  2   Term  2   Preterm  0   AB  0   Living  2     SAB  0   TAB  0   Ectopic  0   Multiple  0   Live Births  2              Patient Active Problem List   Diagnosis Date Noted  . Closed fracture of distal end of radius 03/25/2018  . Other pancytopenia (HCC) 11/09/2014  . 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 Anemia   . Anxiety    with air travel   . Dysmenorrhea   . H/O renal calculi   . Mastalgia   . Ovarian cyst     Past Surgical History:  Procedure Laterality Date  . COLONOSCOPY  10/25/10   negative recheckin 10 years  . WISDOM TOOTH EXTRACTION      Current Outpatient Medications  Medication Sig Dispense Refill  . Ascorbic Acid (VITAMIN C PO) Vitamin C 500 mg tablet  Take 1 tablet twice a day by oral route for 30 days.    . cetirizine (ZYRTEC) 10 MG tablet Take 10 mg by mouth daily.    Marland Kitchen. DEXILANT 60 MG capsule as needed.  0  . estradiol (ESTRING) 2 MG vaginal ring Place 2 mg vaginally every 3 (three) months. Insert a new ring into vagina every 3 months 1 each 4  . Probiotic Product (PROBIOTIC PO) Take by mouth.    Marland Kitchen. UNABLE TO  FIND Allergy Injections once a month     No current facility-administered medications for this visit.      ALLERGIES: Corn-containing products and Sulfa antibiotics  Family History  Problem Relation Age of Onset  . Hypertension Mother   . Thyroid disease Mother   . Heart failure Maternal Grandmother   . Stroke Paternal Grandmother   . Heart failure Paternal Uncle     Social History   Socioeconomic History  . Marital status: Married    Spouse name: Not on file  . Number of children: Not on file  . Years of education: Not on file  . Highest education level: Not on file  Occupational History  . Not on file  Social Needs  . Financial resource strain: Not on file  . Food insecurity:    Worry: Not on file    Inability: Not on file  . Transportation needs:    Medical: Not on file    Non-medical: Not on file  Tobacco Use  . Smoking status: Never Smoker  . Smokeless tobacco: Never Used  Substance and Sexual Activity  . Alcohol use: Yes    Alcohol/week: 0.0 - 1.0 standard drinks    Comment: rarely  . Drug use: No  . Sexual activity: Yes    Partners: Male    Birth control/protection: Post-menopausal  Lifestyle  . Physical activity:    Days per week: Not on file    Minutes per session: Not on file  . Stress: Not on file  Relationships  . Social connections:    Talks on phone: Not on file    Gets together: Not on file    Attends religious service: Not on file    Active member of club or organization: Not on file    Attends meetings of clubs or organizations: Not on file    Relationship status: Not on file  . Intimate partner violence:    Fear of current or ex partner: Not on file    Emotionally abused: Not on file    Physically abused: Not on file    Forced sexual activity: Not on file  Other Topics Concern  . Not on file  Social History Narrative  . Not on file    Review of Systems  Skin:       Vaginal soreness    PHYSICAL EXAMINATION:    Wt 118 lb  (53.5 kg)   LMP 09/28/2008 (Exact Date)   BMI 21.24 kg/m     General appearance: alert, cooperative and appears stated age  Pelvic: External genitalia:  no lesions              Urethra:  normal appearing urethra with no masses, tenderness or lesions              Bartholins and Skenes: normal                 Vagina: mildly atrophic appearing vagina with normal color and discharge, no lesions. Estring removed and discarded.               Cervix: no lesions               Chaperone was present for exam.  Wet prep: no clue, no trich, few wbc KOH: no yeast PH: 4   ASSESSMENT Vaginal atrophy, controlled with estring. Patient unable to remove Vaginal irritation, negative vaginal slides Depression/anxiety. She has a therapist, declines medication. Feels things will improve when the covid 19 crisis is over.     PLAN Estring removed, the patient will place a new one.  Affirm sent Call if she changes her mind about medication.    An After Visit Summary was printed and given to the patient.

## 2018-08-10 NOTE — Addendum Note (Signed)
Addended by: Tobi Bastos on: 08/10/2018 10:40 AM   Modules accepted: Orders

## 2018-08-11 ENCOUNTER — Other Ambulatory Visit: Payer: Self-pay | Admitting: *Deleted

## 2018-08-11 LAB — VAGINITIS/VAGINOSIS, DNA PROBE
Candida Species: NEGATIVE
Gardnerella vaginalis: NEGATIVE
Trichomonas vaginosis: NEGATIVE

## 2018-08-11 NOTE — Telephone Encounter (Signed)
error 

## 2018-08-17 DIAGNOSIS — K219 Gastro-esophageal reflux disease without esophagitis: Secondary | ICD-10-CM | POA: Diagnosis not present

## 2018-08-17 DIAGNOSIS — J301 Allergic rhinitis due to pollen: Secondary | ICD-10-CM | POA: Diagnosis not present

## 2018-08-17 DIAGNOSIS — K200 Eosinophilic esophagitis: Secondary | ICD-10-CM | POA: Diagnosis not present

## 2018-08-17 DIAGNOSIS — Z91018 Allergy to other foods: Secondary | ICD-10-CM | POA: Diagnosis not present

## 2018-08-17 DIAGNOSIS — F4323 Adjustment disorder with mixed anxiety and depressed mood: Secondary | ICD-10-CM | POA: Diagnosis not present

## 2018-08-18 ENCOUNTER — Ambulatory Visit: Payer: BLUE CROSS/BLUE SHIELD | Admitting: Certified Nurse Midwife

## 2018-08-24 DIAGNOSIS — S52561D Barton's fracture of right radius, subsequent encounter for closed fracture with routine healing: Secondary | ICD-10-CM | POA: Diagnosis not present

## 2018-08-26 ENCOUNTER — Other Ambulatory Visit: Payer: Self-pay | Admitting: Family Medicine

## 2018-08-26 ENCOUNTER — Other Ambulatory Visit: Payer: Self-pay | Admitting: Orthopedic Surgery

## 2018-08-26 DIAGNOSIS — S52592A Other fractures of lower end of left radius, initial encounter for closed fracture: Secondary | ICD-10-CM

## 2018-08-26 DIAGNOSIS — Z1231 Encounter for screening mammogram for malignant neoplasm of breast: Secondary | ICD-10-CM

## 2018-08-26 DIAGNOSIS — M25531 Pain in right wrist: Secondary | ICD-10-CM | POA: Diagnosis not present

## 2018-08-31 DIAGNOSIS — J301 Allergic rhinitis due to pollen: Secondary | ICD-10-CM | POA: Diagnosis not present

## 2018-09-03 DIAGNOSIS — M7042 Prepatellar bursitis, left knee: Secondary | ICD-10-CM | POA: Diagnosis not present

## 2018-09-03 DIAGNOSIS — M9905 Segmental and somatic dysfunction of pelvic region: Secondary | ICD-10-CM | POA: Diagnosis not present

## 2018-09-03 DIAGNOSIS — M9902 Segmental and somatic dysfunction of thoracic region: Secondary | ICD-10-CM | POA: Diagnosis not present

## 2018-09-03 DIAGNOSIS — M9903 Segmental and somatic dysfunction of lumbar region: Secondary | ICD-10-CM | POA: Diagnosis not present

## 2018-09-07 DIAGNOSIS — F4323 Adjustment disorder with mixed anxiety and depressed mood: Secondary | ICD-10-CM | POA: Diagnosis not present

## 2018-09-13 DIAGNOSIS — J301 Allergic rhinitis due to pollen: Secondary | ICD-10-CM | POA: Diagnosis not present

## 2018-09-16 DIAGNOSIS — J301 Allergic rhinitis due to pollen: Secondary | ICD-10-CM | POA: Diagnosis not present

## 2018-09-21 DIAGNOSIS — F4323 Adjustment disorder with mixed anxiety and depressed mood: Secondary | ICD-10-CM | POA: Diagnosis not present

## 2018-09-29 DIAGNOSIS — M7042 Prepatellar bursitis, left knee: Secondary | ICD-10-CM | POA: Diagnosis not present

## 2018-09-29 DIAGNOSIS — M9903 Segmental and somatic dysfunction of lumbar region: Secondary | ICD-10-CM | POA: Diagnosis not present

## 2018-09-29 DIAGNOSIS — M9902 Segmental and somatic dysfunction of thoracic region: Secondary | ICD-10-CM | POA: Diagnosis not present

## 2018-09-29 DIAGNOSIS — M9905 Segmental and somatic dysfunction of pelvic region: Secondary | ICD-10-CM | POA: Diagnosis not present

## 2018-10-01 DIAGNOSIS — J301 Allergic rhinitis due to pollen: Secondary | ICD-10-CM | POA: Diagnosis not present

## 2018-10-05 DIAGNOSIS — F4323 Adjustment disorder with mixed anxiety and depressed mood: Secondary | ICD-10-CM | POA: Diagnosis not present

## 2018-10-11 DIAGNOSIS — J301 Allergic rhinitis due to pollen: Secondary | ICD-10-CM | POA: Diagnosis not present

## 2018-10-20 DIAGNOSIS — F4323 Adjustment disorder with mixed anxiety and depressed mood: Secondary | ICD-10-CM | POA: Diagnosis not present

## 2018-10-21 ENCOUNTER — Ambulatory Visit
Admission: RE | Admit: 2018-10-21 | Discharge: 2018-10-21 | Disposition: A | Discharge status: Home / Self Care | Payer: BC Managed Care – PPO | Source: Ambulatory Visit | Attending: Family Medicine | Admitting: Family Medicine

## 2018-10-21 ENCOUNTER — Other Ambulatory Visit: Payer: Self-pay

## 2018-10-21 DIAGNOSIS — Z1231 Encounter for screening mammogram for malignant neoplasm of breast: Secondary | ICD-10-CM

## 2018-10-26 DIAGNOSIS — K219 Gastro-esophageal reflux disease without esophagitis: Secondary | ICD-10-CM | POA: Diagnosis not present

## 2018-10-26 DIAGNOSIS — R05 Cough: Secondary | ICD-10-CM | POA: Diagnosis not present

## 2018-10-26 DIAGNOSIS — J301 Allergic rhinitis due to pollen: Secondary | ICD-10-CM | POA: Diagnosis not present

## 2018-10-28 DIAGNOSIS — J301 Allergic rhinitis due to pollen: Secondary | ICD-10-CM | POA: Diagnosis not present

## 2018-11-02 DIAGNOSIS — F4323 Adjustment disorder with mixed anxiety and depressed mood: Secondary | ICD-10-CM | POA: Diagnosis not present

## 2018-11-02 DIAGNOSIS — J301 Allergic rhinitis due to pollen: Secondary | ICD-10-CM | POA: Diagnosis not present

## 2018-11-04 DIAGNOSIS — J301 Allergic rhinitis due to pollen: Secondary | ICD-10-CM | POA: Diagnosis not present

## 2018-11-08 DIAGNOSIS — J301 Allergic rhinitis due to pollen: Secondary | ICD-10-CM | POA: Diagnosis not present

## 2018-11-16 DIAGNOSIS — J301 Allergic rhinitis due to pollen: Secondary | ICD-10-CM | POA: Diagnosis not present

## 2018-11-16 DIAGNOSIS — F4323 Adjustment disorder with mixed anxiety and depressed mood: Secondary | ICD-10-CM | POA: Diagnosis not present

## 2018-11-16 DIAGNOSIS — K200 Eosinophilic esophagitis: Secondary | ICD-10-CM | POA: Diagnosis not present

## 2018-11-16 DIAGNOSIS — Z91018 Allergy to other foods: Secondary | ICD-10-CM | POA: Diagnosis not present

## 2018-11-16 DIAGNOSIS — K219 Gastro-esophageal reflux disease without esophagitis: Secondary | ICD-10-CM | POA: Diagnosis not present

## 2018-11-22 DIAGNOSIS — J301 Allergic rhinitis due to pollen: Secondary | ICD-10-CM | POA: Diagnosis not present

## 2018-11-23 ENCOUNTER — Other Ambulatory Visit: Payer: Self-pay

## 2018-11-24 ENCOUNTER — Encounter: Payer: Self-pay | Admitting: Certified Nurse Midwife

## 2018-11-24 ENCOUNTER — Ambulatory Visit: Payer: BC Managed Care – PPO | Admitting: Certified Nurse Midwife

## 2018-11-24 ENCOUNTER — Other Ambulatory Visit: Payer: Self-pay

## 2018-11-24 VITALS — BP 102/64 | HR 64 | Temp 97.2°F | Resp 16 | Ht 62.5 in | Wt 117.0 lb

## 2018-11-24 DIAGNOSIS — N951 Menopausal and female climacteric states: Secondary | ICD-10-CM

## 2018-11-24 DIAGNOSIS — Z01419 Encounter for gynecological examination (general) (routine) without abnormal findings: Secondary | ICD-10-CM

## 2018-11-24 DIAGNOSIS — N952 Postmenopausal atrophic vaginitis: Secondary | ICD-10-CM

## 2018-11-24 MED ORDER — ESTRADIOL 2 MG VA RING: 2.0000 mg | VAGINAL_RING | VAGINAL | 4 refills | Status: DC

## 2018-11-24 NOTE — Progress Notes (Signed)
58 y.o. [id number] Married  Caucasian Fe here for annual exam. Menopausal no HRT. Denies vaginal bleeding. Using Estring for dryness. Sees PCP yearly for aex and labs. Having reflux with allergies and saw GI, now on medication, so much better. Father passed away 3 weeks ago due to fall. Emotionally still grieving and helping mother adjust. Sees PCP for labs and aex. Has BMD scheduled soon. No other health issues today.  Patient's last menstrual period was 09/28/2008 (exact date).          Sexually active: Yes.    The current method of family planning is post menopausal status.    Exercising: Yes.    running, weights, yoga, walking Smoker:  no  Review of Systems  Constitutional: Negative.   HENT: Negative.   Eyes: Negative.   Respiratory: Negative.   Cardiovascular: Negative.   Gastrointestinal: Negative.   Genitourinary: Negative.   Musculoskeletal: Negative.   Skin: Negative.   Neurological: Negative.   Endo/Heme/Allergies: Negative.   Psychiatric/Behavioral: Negative.     Health Maintenance: Pap: 11-18-17 neg HPV HR neg History of Abnormal Pap: no MMG:  10-21-2018 category b density birads 1:neg Self Breast exams: occ Colonoscopy:  2012 f/u 1641yrs, endoscopy 07/2018 BMD:  Scheduled due to fractured wrist TDaP:  2016 Shingles: no Pneumonia: no Hep C and HIV: had done when married Labs: with PCP   reports that she has never smoked. She has never used smokeless tobacco. She reports current alcohol use. She reports that she does not use drugs.  Past Medical History:  Diagnosis Date  . Anemia   . Anxiety    with air travel   . Dysmenorrhea   . H/O renal calculi   . Mastalgia   . Ovarian cyst     Past Surgical History:  Procedure Laterality Date  . COLONOSCOPY  10/25/10   negative recheckin 10 years  . WISDOM TOOTH EXTRACTION      Current Outpatient Medications  Medication Sig Dispense Refill  . Ascorbic Acid (VITAMIN C PO) Vitamin C 500 mg tablet  Take 1 tablet twice a  day by oral route for 30 days.    . cetirizine (ZYRTEC) 10 MG tablet Take 10 mg by mouth daily.    Marland Kitchen. estradiol (ESTRING) 2 MG vaginal ring Place 2 mg vaginally every 3 (three) months. Insert a new ring into vagina every 3 months 1 each 4  . pantoprazole (PROTONIX) 40 MG tablet     . Probiotic Product (PROBIOTIC PO) Take by mouth.    Marland Kitchen. UNABLE TO FIND Allergy Injections once a month     No current facility-administered medications for this visit.     Family History  Problem Relation Age of Onset  . Hypertension Mother   . Thyroid disease Mother   . Heart failure Maternal Grandmother   . Stroke Paternal Grandmother   . Heart failure Paternal Uncle     ROS:  Pertinent items are noted in HPI.  Otherwise, a comprehensive ROS was negative.  Exam:   BP 102/64   Pulse 64   Temp (!) 97.2 F (36.2 C) (Skin)   Resp 16   Ht 5' 2.5" (1.588 m)   Wt 117 lb (53.1 kg)   LMP 09/28/2008 (Exact Date)   BMI 21.06 kg/m  Height: 5' 2.5" (158.8 cm) Ht Readings from Last 3 Encounters:  11/24/18 5' 2.5" (1.588 m)  11/18/17 5' 2.5" (1.588 m)  08/13/17 5' 2.75" (1.594 m)    General appearance: alert, cooperative and appears  stated age Head: Normocephalic, without obvious abnormality, atraumatic Neck: no adenopathy, supple, symmetrical, trachea midline and thyroid normal to inspection and palpation Lungs: clear to auscultation bilaterally Breasts: normal appearance, no masses or tenderness, No nipple retraction or dimpling, No nipple discharge or bleeding, No axillary or supraclavicular adenopathy Heart: regular rate and rhythm Abdomen: soft, non-tender; no masses,  no organomegaly Extremities: extremities normal, atraumatic, no cyanosis or edema Skin: Skin color, texture, turgor normal. No rashes or lesions Lymph nodes: Cervical, supraclavicular, and axillary nodes normal. No abnormal inguinal nodes palpated Neurologic: Grossly normal   Pelvic: External genitalia:  no lesions               Urethra:  normal appearing urethra with no masses, tenderness or lesions              Bartholin's and Skene's: normal                 Vagina: normal appearing vagina with normal color and discharge, no lesions              Cervix: no cervical motion tenderness, no lesions and normal appearance, no lesions or excoriations              Pap taken: No. Bimanual Exam:  Uterus:  normal size, contour, position, consistency, mobility, non-tender and anteverted              Adnexa: normal adnexa and no mass, fullness, tenderness               Rectovaginal: Confirms               Anus:  normal sphincter tone, no lesions  Chaperone present: yes  A:  Well Woman with normal exam  Menopausal no HRT  Atrophic vaginitis with Estring use with good results, desires continuance  Labs with PCP  Emotional stress with death of father  P:   Reviewed health and wellness pertinent to exam  Aware of need to advise if vaginal bleeding  Discussed risks/benefits/warnings with Estring use. Desires continuance.  Rx Estring see order with instructions  Stressed time for self and seek support from friends and family  Pap smear: no  counseled on breast self exam, mammography screening, feminine hygiene, adequate intake of calcium and vitamin D, diet and exercise  return annually or prn  An After Visit Summary was printed and given to the patient.

## 2018-11-30 ENCOUNTER — Other Ambulatory Visit: Payer: BLUE CROSS/BLUE SHIELD

## 2018-12-06 DIAGNOSIS — J301 Allergic rhinitis due to pollen: Secondary | ICD-10-CM | POA: Diagnosis not present

## 2018-12-07 DIAGNOSIS — F4323 Adjustment disorder with mixed anxiety and depressed mood: Secondary | ICD-10-CM | POA: Diagnosis not present

## 2018-12-14 ENCOUNTER — Telehealth: Payer: Self-pay | Admitting: *Deleted

## 2018-12-14 NOTE — Telephone Encounter (Signed)
PA request recived from [facility] for Estring 2mg  Vaginal Ring.   Form completed and to Melvia Heaps, CNM to review and sign.

## 2018-12-20 DIAGNOSIS — J301 Allergic rhinitis due to pollen: Secondary | ICD-10-CM | POA: Diagnosis not present

## 2018-12-21 ENCOUNTER — Other Ambulatory Visit: Payer: Self-pay

## 2018-12-21 ENCOUNTER — Ambulatory Visit
Admission: RE | Admit: 2018-12-21 | Discharge: 2018-12-21 | Disposition: A | Discharge status: Home / Self Care | Payer: BC Managed Care – PPO | Source: Ambulatory Visit | Attending: Orthopedic Surgery | Admitting: Orthopedic Surgery

## 2018-12-21 DIAGNOSIS — S52592A Other fractures of lower end of left radius, initial encounter for closed fracture: Secondary | ICD-10-CM

## 2018-12-21 DIAGNOSIS — Z780 Asymptomatic menopausal state: Secondary | ICD-10-CM | POA: Diagnosis not present

## 2018-12-21 DIAGNOSIS — M85851 Other specified disorders of bone density and structure, right thigh: Secondary | ICD-10-CM | POA: Diagnosis not present

## 2018-12-21 DIAGNOSIS — M810 Age-related osteoporosis without current pathological fracture: Secondary | ICD-10-CM | POA: Diagnosis not present

## 2018-12-23 DIAGNOSIS — F4323 Adjustment disorder with mixed anxiety and depressed mood: Secondary | ICD-10-CM | POA: Diagnosis not present

## 2018-12-23 NOTE — Telephone Encounter (Signed)
Late entry: PA for Estring faxed to [facility] on 12/14/18.

## 2018-12-27 ENCOUNTER — Other Ambulatory Visit: Payer: Self-pay | Admitting: *Deleted

## 2018-12-27 NOTE — Telephone Encounter (Signed)
Call placed to [employer] to f/u on PA, no response received to date. Was on hold for 15 min, will try again later.

## 2018-12-27 NOTE — Progress Notes (Signed)
Opened in error. Encounter closed.

## 2018-12-28 DIAGNOSIS — Z23 Encounter for immunization: Secondary | ICD-10-CM | POA: Diagnosis not present

## 2018-12-28 NOTE — Telephone Encounter (Signed)
Spoke with Paris B. at [place]. Was advised PA was submitted in 08/2018 for Estring and denied, required patient try 2 covered alternatives. PA received 12/14/18 and again was denied, sent for Peer to Peer courtesy review since patient has tried 1 alternative, again denied. Patient can submit appeal or try recommended covered alternatives on plan. Will fax copy of denial with alternative to [facility].

## 2018-12-28 NOTE — Telephone Encounter (Signed)
Fax notification received from [facility] for [place].   Call to patient. Advised Estring 2mg  vaginal ring PA denied.Requires two alternative medication on the members formulary been tried and failed. Only one alternative has been tried. Alternative meds are premarin vaginal cream, brand vagifem, etc. Reviewed options of submitting appeal or paying out of pocket. Our office can submit appeal, patient will need to complete BCBS authorization form attached to appeal giving authorization or patient may submit appeal on her behalf. Patient states she will submit appeal. Patient is aware to return call to office if any additional assistance is needed.   Routing to provider for final review. Patient is agreeable to disposition. Will close encounter.

## 2019-01-03 DIAGNOSIS — J301 Allergic rhinitis due to pollen: Secondary | ICD-10-CM | POA: Diagnosis not present

## 2019-01-12 DIAGNOSIS — F4323 Adjustment disorder with mixed anxiety and depressed mood: Secondary | ICD-10-CM | POA: Diagnosis not present

## 2019-01-17 DIAGNOSIS — J301 Allergic rhinitis due to pollen: Secondary | ICD-10-CM | POA: Diagnosis not present

## 2019-01-17 DIAGNOSIS — J3089 Other allergic rhinitis: Secondary | ICD-10-CM | POA: Diagnosis not present

## 2019-01-24 DIAGNOSIS — M9905 Segmental and somatic dysfunction of pelvic region: Secondary | ICD-10-CM | POA: Diagnosis not present

## 2019-01-24 DIAGNOSIS — M7042 Prepatellar bursitis, left knee: Secondary | ICD-10-CM | POA: Diagnosis not present

## 2019-01-24 DIAGNOSIS — M9902 Segmental and somatic dysfunction of thoracic region: Secondary | ICD-10-CM | POA: Diagnosis not present

## 2019-01-24 DIAGNOSIS — M9903 Segmental and somatic dysfunction of lumbar region: Secondary | ICD-10-CM | POA: Diagnosis not present

## 2019-01-26 DIAGNOSIS — F4323 Adjustment disorder with mixed anxiety and depressed mood: Secondary | ICD-10-CM | POA: Diagnosis not present

## 2019-01-31 DIAGNOSIS — J301 Allergic rhinitis due to pollen: Secondary | ICD-10-CM | POA: Diagnosis not present

## 2019-02-09 DIAGNOSIS — F4323 Adjustment disorder with mixed anxiety and depressed mood: Secondary | ICD-10-CM | POA: Diagnosis not present

## 2019-02-14 DIAGNOSIS — J301 Allergic rhinitis due to pollen: Secondary | ICD-10-CM | POA: Diagnosis not present

## 2019-02-14 DIAGNOSIS — J3089 Other allergic rhinitis: Secondary | ICD-10-CM | POA: Diagnosis not present

## 2019-02-21 DIAGNOSIS — D2239 Melanocytic nevi of other parts of face: Secondary | ICD-10-CM | POA: Diagnosis not present

## 2019-02-21 DIAGNOSIS — L280 Lichen simplex chronicus: Secondary | ICD-10-CM | POA: Diagnosis not present

## 2019-02-21 DIAGNOSIS — L700 Acne vulgaris: Secondary | ICD-10-CM | POA: Diagnosis not present

## 2019-02-21 DIAGNOSIS — D225 Melanocytic nevi of trunk: Secondary | ICD-10-CM | POA: Diagnosis not present

## 2019-02-21 DIAGNOSIS — R21 Rash and other nonspecific skin eruption: Secondary | ICD-10-CM | POA: Diagnosis not present

## 2019-02-21 DIAGNOSIS — D485 Neoplasm of uncertain behavior of skin: Secondary | ICD-10-CM | POA: Diagnosis not present

## 2019-02-21 DIAGNOSIS — L821 Other seborrheic keratosis: Secondary | ICD-10-CM | POA: Diagnosis not present

## 2019-02-22 ENCOUNTER — Other Ambulatory Visit: Payer: Self-pay

## 2019-02-23 DIAGNOSIS — F4323 Adjustment disorder with mixed anxiety and depressed mood: Secondary | ICD-10-CM | POA: Diagnosis not present

## 2019-02-25 ENCOUNTER — Encounter: Payer: Self-pay | Admitting: Certified Nurse Midwife

## 2019-02-25 ENCOUNTER — Other Ambulatory Visit: Payer: Self-pay

## 2019-02-25 ENCOUNTER — Ambulatory Visit: Payer: BC Managed Care – PPO | Admitting: Certified Nurse Midwife

## 2019-02-25 VITALS — BP 110/64 | HR 68 | Temp 97.1°F | Resp 16 | Wt 121.0 lb

## 2019-02-25 DIAGNOSIS — N952 Postmenopausal atrophic vaginitis: Secondary | ICD-10-CM

## 2019-02-25 NOTE — Patient Instructions (Signed)
Estradiol vaginal ring (Estring) What is this medicine? ESTRADIOL (es tra DYE ole) vaginal ring is an insert that contains a female hormone. This medicine helps relieve symptoms of vaginal irritation and dryness that occurs in some women during menopause. This medicine may be used for other purposes; ask your health care provider or pharmacist if you have questions. COMMON BRAND NAME(S): Estring What should I tell my health care provider before I take this medicine? They need to know if you have any of these conditions:  abnormal vaginal bleeding  blood vessel disease or blood clots  breast, cervical, endometrial, ovarian, liver, or uterine cancer  dementia  diabetes  gallbladder disease  heart disease or recent heart attack  high blood pressure  high cholesterol  high level of calcium in the blood  hysterectomy  kidney disease  liver disease  migraine headaches  protein C deficiency  protein S deficiency  stroke  systemic lupus erythematosus (SLE)  tobacco smoker  an unusual or allergic reaction to estrogens, other hormones, medicines, foods, dyes, or preservatives  pregnant or trying to get pregnant  breast-feeding How should I use this medicine? This medicine may be inserted by you or your physician. Follow the directions that are included with your prescription. If you are unsure how to insert the ring, contact your doctor or health care professional. The vaginal ring should remain in place for 90 days. After 90 days you should replace your old ring and insert a new one. Do not stop using except on the advice of your doctor or health care professional. Contact your pediatrician regarding the use of this medicine in children. Special care may be needed. A patient package insert for the product will be given with each prescription and refill. Read this sheet carefully each time. The sheet may change frequently. Overdosage: If you think you have taken too much  of this medicine contact a poison control center or emergency room at once. NOTE: This medicine is only for you. Do not share this medicine with others. What if I miss a dose? If you miss a dose, use it as soon as you can. If it is almost time for your next dose, use only that dose. Do not use double or extra doses. What may interact with this medicine? Do not take this medicine with any of the following medications:  aromatase inhibitors like aminoglutethimide, anastrozole, exemestane, letrozole, testolactone, vorozole This medicine may also interact with the following medications:  carbamazepine  certain antibiotics used to treat infections  certain barbiturates used for inducing sleep or treating seizures  grapefruit juice  medicines for fungus infections like itraconazole and ketoconazole  raloxifene or tamoxifen  rifabutin, rifampin, or rifapentine  ritonavir  St. John's Wort This list may not describe all possible interactions. Give your health care provider a list of all the medicines, herbs, non-prescription drugs, or dietary supplements you use. Also tell them if you smoke, drink alcohol, or use illegal drugs. Some items may interact with your medicine. What should I watch for while using this medicine? Visit your doctor or health care professional for regular checks on your progress. You will need a regular breast and pelvic exam and Pap smear while on this medicine. You should also discuss the need for regular mammograms with your health care professional, and follow his or her guidelines for these tests. This medicine can make your body retain fluid, making your fingers, hands, or ankles swell. Your blood pressure can go up. Contact your doctor or   health care professional if you feel you are retaining fluid. If you have any reason to think you are pregnant, stop taking this medicine right away and contact your doctor or health care professional. Smoking increases the risk  of getting a blood clot or having a stroke while you are taking this medicine, especially if you are more than 59 years old. You are strongly advised not to smoke. If you wear contact lenses and notice visual changes, or if the lenses begin to feel uncomfortable, consult your eye doctor or health care professional. This medicine can increase the risk of developing a condition (endometrial hyperplasia) that may lead to cancer of the lining of the uterus. Taking progestins, another hormone drug, with this medicine lowers the risk of developing this condition. Therefore, if your uterus has not been removed (by a hysterectomy), your doctor may prescribe a progestin for you to take together with your estrogen. You should know, however, that taking estrogens with progestins may have additional health risks. You should discuss the use of estrogens and progestins with your health care professional to determine the benefits and risks for you. If you are going to have surgery, you may need to stop taking this medicine. Consult your health care professional for advice before you schedule the surgery. You may bathe or participate in other activities while using this medicine. You do not need to remove the vaginal ring during sexual or other activities unless you are more comfortable doing so. Within the 90-day dosage period, you may remove the vaginal ring, rinse it with clean lukewarm (not hot or boiling) water, and re-insert the ring as needed. What side effects may I notice from receiving this medicine? Side effects that you should report to your doctor or health care professional as soon as possible:  allergic reactions like skin rash, itching or hives, swelling of the face, lips, or tongue  breast tissue changes or discharge  signs and symptoms of a blood clot such as breathing problems; changes in vision; chest pain; severe, sudden headache; pain, swelling, warmth in the leg; trouble speaking; sudden numbness  or weakness of the face, arm or leg  signs and symptoms of infection like fever or chills; vomiting; diarrhea; muscle pain; dizziness; or a red, sunburn-like rash on face and body  signs and symptoms of liver injury like dark yellow or brown urine; general ill feeling or flu-like symptoms; light-colored stools; loss of appetite; nausea; right upper belly pain; unusually weak or tired; yellowing of the eyes or skin  symptoms of bowel blockage like constipation, abdominal swelling, abdominal pain, inability to pass gas or have a bowel movement  symptoms of vaginal infection like itching, irritation or unusual discharge  unusual or increased vaginal bleeding  vaginal pain or soreness, redness, swelling Side effects that usually do not require medical attention (report to your doctor or health care professional if they continue or are bothersome):  breast tenderness  fluid retention  hair loss  headache  nausea  upset stomach  vaginal spotting This list may not describe all possible side effects. Call your doctor for medical advice about side effects. You may report side effects to FDA at 1-800-FDA-1088. Where should I keep my medicine? Keep out of the reach of children. Store at room temperature between 15 and 25 degrees C (59 and 77 degrees F). Throw away any unused medicine after the expiration date. NOTE: This sheet is a summary. It may not cover all possible information. If you have questions about   this medicine, talk to your doctor, pharmacist, or health care provider.  2020 Elsevier/Gold Standard (2014-02-13 13:20:25)  

## 2019-02-25 NOTE — Progress Notes (Signed)
Review of Systems  Constitutional: Negative.   HENT: Negative.   Eyes: Negative.   Respiratory: Negative.   Cardiovascular: Negative.   Gastrointestinal: Negative.   Genitourinary: Negative.   Musculoskeletal: Negative.   Skin: Negative.   Neurological: Negative.   Endo/Heme/Allergies: Negative.   Psychiatric/Behavioral: Negative.     58 y.o. Married Caucasian female G2P2002 here for Estring removal using for atrophic vaginitis, working well. Patient has difficulty removing each time and prefers removal here and she inserts new one at home. Denies vaginal bleeding or pelvic pain. No other health issues today.   O: Healthy WD,WN female Affect: normal Skin:warm and dry Abdomen:soft, non tender Pelvic exam:EXTERNAL GENITALIA: normal appearing vulva with no masses, tenderness or lesions VAGINA: no abnormal discharge or lesions, Estring palpated and removed without difficulty, no vaginal bleeding noted CERVIX: no lesions or cervical motion tenderness UTERUS:normal to palpation, mid position ADNEXA: no masses palpable and nontender RECTUM: exam not indicated  A:Atrophic vaginitis using Estring with good results. Has new Estring at home to insert.  P: Discussed findings of normal appearance. Risks/benefits/warning signs with use. Desires continuance. Will schedule RV 3 months for removal.  Rv as above, prn

## 2019-02-28 DIAGNOSIS — J301 Allergic rhinitis due to pollen: Secondary | ICD-10-CM | POA: Diagnosis not present

## 2019-03-16 DIAGNOSIS — F4323 Adjustment disorder with mixed anxiety and depressed mood: Secondary | ICD-10-CM | POA: Diagnosis not present

## 2019-03-17 DIAGNOSIS — J301 Allergic rhinitis due to pollen: Secondary | ICD-10-CM | POA: Diagnosis not present

## 2019-03-18 DIAGNOSIS — L438 Other lichen planus: Secondary | ICD-10-CM | POA: Diagnosis not present

## 2019-03-18 DIAGNOSIS — B078 Other viral warts: Secondary | ICD-10-CM | POA: Diagnosis not present

## 2019-03-25 DIAGNOSIS — Z20828 Contact with and (suspected) exposure to other viral communicable diseases: Secondary | ICD-10-CM | POA: Diagnosis not present

## 2019-03-28 DIAGNOSIS — J301 Allergic rhinitis due to pollen: Secondary | ICD-10-CM | POA: Diagnosis not present

## 2019-03-29 DIAGNOSIS — Z20828 Contact with and (suspected) exposure to other viral communicable diseases: Secondary | ICD-10-CM | POA: Diagnosis not present

## 2019-04-07 DIAGNOSIS — F4323 Adjustment disorder with mixed anxiety and depressed mood: Secondary | ICD-10-CM | POA: Diagnosis not present

## 2019-04-11 DIAGNOSIS — J301 Allergic rhinitis due to pollen: Secondary | ICD-10-CM | POA: Diagnosis not present

## 2019-04-21 DIAGNOSIS — F4323 Adjustment disorder with mixed anxiety and depressed mood: Secondary | ICD-10-CM | POA: Diagnosis not present

## 2019-04-25 DIAGNOSIS — J301 Allergic rhinitis due to pollen: Secondary | ICD-10-CM | POA: Diagnosis not present

## 2019-04-27 ENCOUNTER — Ambulatory Visit: Payer: BC Managed Care – PPO | Attending: Internal Medicine

## 2019-04-27 DIAGNOSIS — R238 Other skin changes: Secondary | ICD-10-CM | POA: Diagnosis not present

## 2019-04-27 DIAGNOSIS — U071 COVID-19: Secondary | ICD-10-CM

## 2019-04-28 LAB — NOVEL CORONAVIRUS, NAA: SARS-CoV-2, NAA: NOT DETECTED

## 2019-05-12 DIAGNOSIS — F4323 Adjustment disorder with mixed anxiety and depressed mood: Secondary | ICD-10-CM | POA: Diagnosis not present

## 2019-05-23 DIAGNOSIS — J301 Allergic rhinitis due to pollen: Secondary | ICD-10-CM | POA: Diagnosis not present

## 2019-05-26 ENCOUNTER — Other Ambulatory Visit: Payer: Self-pay

## 2019-05-26 ENCOUNTER — Telehealth: Payer: Self-pay | Admitting: Certified Nurse Midwife

## 2019-05-26 DIAGNOSIS — F4323 Adjustment disorder with mixed anxiety and depressed mood: Secondary | ICD-10-CM | POA: Diagnosis not present

## 2019-05-26 DIAGNOSIS — J301 Allergic rhinitis due to pollen: Secondary | ICD-10-CM | POA: Diagnosis not present

## 2019-05-26 NOTE — Telephone Encounter (Signed)
Spoke to pt. Pt went to [place] on 04/30/19 and tested positive CPS on 05/09/2019. Pt was asymptomatic. Pt denies symptoms of Covid now. Pt has scheduled OV on 05/30/2019 for Estring removal. Pt aware will discuss with Debbi, CNM and will call back to confirm appt for Monday. Pt agreeable.   Will route to D. Darcel Bayley, CNM for review.

## 2019-05-26 NOTE — Telephone Encounter (Signed)
Patient tested positive on 05/09/19 after going to [employer] last month. Sending message to triage to access.

## 2019-05-27 NOTE — Telephone Encounter (Signed)
Left detailed message ok per DPR. Pt to keep appt for Monday with DL.   Will route to D. Darcel Bayley, CNM for review and will close encounter.

## 2019-05-27 NOTE — Telephone Encounter (Signed)
OK to keep appointment

## 2019-05-30 ENCOUNTER — Other Ambulatory Visit: Payer: Self-pay

## 2019-05-30 ENCOUNTER — Ambulatory Visit: Payer: BC Managed Care – PPO | Admitting: Certified Nurse Midwife

## 2019-05-30 ENCOUNTER — Encounter: Payer: Self-pay | Admitting: Certified Nurse Midwife

## 2019-05-30 VITALS — BP 110/68 | HR 68 | Temp 97.0°F | Resp 16 | Wt 121.0 lb

## 2019-05-30 DIAGNOSIS — N952 Postmenopausal atrophic vaginitis: Secondary | ICD-10-CM

## 2019-05-30 NOTE — Progress Notes (Signed)
Review of Systems  Constitutional: Negative.  Negative for malaise/fatigue.       Allergies  HENT: Negative.   Eyes: Negative.   Respiratory: Negative.   Cardiovascular: Negative.   Gastrointestinal: Negative.   Genitourinary: Negative.   Musculoskeletal: Negative.   Skin: Negative.   Neurological: Negative.   Endo/Heme/Allergies: Negative.   Psychiatric/Behavioral: Negative.     59 y.o. Married Caucasian G2P2002 here for removal of Estring and re-insertion of new Estring, using for vaginal atrophy.  Denies vaginal bleeding or vaginal dryness changes. No pain with sexual activity. No urinary infections or symptoms. Was positive for Covid and had mild case. Some fatigue, no other symptoms. Denies headaches,  DVT warning signs or symptoms.No other health issues today. Happy with Estring use. Mammogram current.  O: Healthy female, WD WN Affect: normal orientation X 3  Skin: warm and dry Inguinal lymph nodes: not tender, no enlargement. External genital area: normal female appearance slight atrophic appearing Vagina: normal appearance, moisture noted, non tender, Estring located and removed without difficulty, shown to patient. No excoriations noted Cervix: normal, no CMT  Uterus: normal, non tender Adnexa: palpated normal no masses Rectal area: normal appearance no lesions    A: History of atrophic vaginitis, Estring working well desires continuance Normal pelvic exam  P: Warning signs with use reviewed and need to advise if vaginal bleeding or vaginal pain. Patient will insert new Estring at home.  Rv 3 months

## 2019-05-30 NOTE — Patient Instructions (Signed)
Estradiol vaginal ring (Estring) What is this medicine? ESTRADIOL (es tra DYE ole) vaginal ring is an insert that contains a female hormone. This medicine helps relieve symptoms of vaginal irritation and dryness that occurs in some women during menopause. This medicine may be used for other purposes; ask your health care provider or pharmacist if you have questions. COMMON BRAND NAME(S): Estring What should I tell my health care provider before I take this medicine? They need to know if you have any of these conditions:  abnormal vaginal bleeding  blood vessel disease or blood clots  breast, cervical, endometrial, ovarian, liver, or uterine cancer  dementia  diabetes  gallbladder disease  heart disease or recent heart attack  high blood pressure  high cholesterol  high level of calcium in the blood  hysterectomy  kidney disease  liver disease  migraine headaches  protein C deficiency  protein S deficiency  stroke  systemic lupus erythematosus (SLE)  tobacco smoker  an unusual or allergic reaction to estrogens, other hormones, medicines, foods, dyes, or preservatives  pregnant or trying to get pregnant  breast-feeding How should I use this medicine? This medicine may be inserted by you or your physician. Follow the directions that are included with your prescription. If you are unsure how to insert the ring, contact your doctor or health care professional. The vaginal ring should remain in place for 90 days. After 90 days you should replace your old ring and insert a new one. Do not stop using except on the advice of your doctor or health care professional. Contact your pediatrician regarding the use of this medicine in children. Special care may be needed. A patient package insert for the product will be given with each prescription and refill. Read this sheet carefully each time. The sheet may change frequently. Overdosage: If you think you have taken too much  of this medicine contact a poison control center or emergency room at once. NOTE: This medicine is only for you. Do not share this medicine with others. What if I miss a dose? If you miss a dose, use it as soon as you can. If it is almost time for your next dose, use only that dose. Do not use double or extra doses. What may interact with this medicine? Do not take this medicine with any of the following medications:  aromatase inhibitors like aminoglutethimide, anastrozole, exemestane, letrozole, testolactone, vorozole This medicine may also interact with the following medications:  carbamazepine  certain antibiotics used to treat infections  certain barbiturates used for inducing sleep or treating seizures  grapefruit juice  medicines for fungus infections like itraconazole and ketoconazole  raloxifene or tamoxifen  rifabutin, rifampin, or rifapentine  ritonavir  St. John's Wort This list may not describe all possible interactions. Give your health care provider a list of all the medicines, herbs, non-prescription drugs, or dietary supplements you use. Also tell them if you smoke, drink alcohol, or use illegal drugs. Some items may interact with your medicine. What should I watch for while using this medicine? Visit your doctor or health care professional for regular checks on your progress. You will need a regular breast and pelvic exam and Pap smear while on this medicine. You should also discuss the need for regular mammograms with your health care professional, and follow his or her guidelines for these tests. This medicine can make your body retain fluid, making your fingers, hands, or ankles swell. Your blood pressure can go up. Contact your doctor or  health care professional if you feel you are retaining fluid. If you have any reason to think you are pregnant, stop taking this medicine right away and contact your doctor or health care professional. Smoking increases the risk  of getting a blood clot or having a stroke while you are taking this medicine, especially if you are more than 60 years old. You are strongly advised not to smoke. If you wear contact lenses and notice visual changes, or if the lenses begin to feel uncomfortable, consult your eye doctor or health care professional. This medicine can increase the risk of developing a condition (endometrial hyperplasia) that may lead to cancer of the lining of the uterus. Taking progestins, another hormone drug, with this medicine lowers the risk of developing this condition. Therefore, if your uterus has not been removed (by a hysterectomy), your doctor may prescribe a progestin for you to take together with your estrogen. You should know, however, that taking estrogens with progestins may have additional health risks. You should discuss the use of estrogens and progestins with your health care professional to determine the benefits and risks for you. If you are going to have surgery, you may need to stop taking this medicine. Consult your health care professional for advice before you schedule the surgery. You may bathe or participate in other activities while using this medicine. You do not need to remove the vaginal ring during sexual or other activities unless you are more comfortable doing so. Within the 90-day dosage period, you may remove the vaginal ring, rinse it with clean lukewarm (not hot or boiling) water, and re-insert the ring as needed. What side effects may I notice from receiving this medicine? Side effects that you should report to your doctor or health care professional as soon as possible:  allergic reactions like skin rash, itching or hives, swelling of the face, lips, or tongue  breast tissue changes or discharge  signs and symptoms of a blood clot such as breathing problems; changes in vision; chest pain; severe, sudden headache; pain, swelling, warmth in the leg; trouble speaking; sudden numbness  or weakness of the face, arm or leg  signs and symptoms of infection like fever or chills; vomiting; diarrhea; muscle pain; dizziness; or a red, sunburn-like rash on face and body  signs and symptoms of liver injury like dark yellow or brown urine; general ill feeling or flu-like symptoms; light-colored stools; loss of appetite; nausea; right upper belly pain; unusually weak or tired; yellowing of the eyes or skin  symptoms of bowel blockage like constipation, abdominal swelling, abdominal pain, inability to pass gas or have a bowel movement  symptoms of vaginal infection like itching, irritation or unusual discharge  unusual or increased vaginal bleeding  vaginal pain or soreness, redness, swelling Side effects that usually do not require medical attention (report to your doctor or health care professional if they continue or are bothersome):  breast tenderness  fluid retention  hair loss  headache  nausea  upset stomach  vaginal spotting This list may not describe all possible side effects. Call your doctor for medical advice about side effects. You may report side effects to FDA at 1-800-FDA-1088. Where should I keep my medicine? Keep out of the reach of children. Store at room temperature between 15 and 25 degrees C (59 and 77 degrees F). Throw away any unused medicine after the expiration date. NOTE: This sheet is a summary. It may not cover all possible information. If you have questions about   this medicine, talk to your doctor, pharmacist, or health care provider.  2020 Elsevier/Gold Standard (2014-02-13 13:20:25) Atrophic Vaginitis Atrophic vaginitis is a condition in which the tissues that line the vagina become dry and thin. This condition occurs in women who have stopped having their period. It is caused by a drop in a female hormone (estrogen). This hormone helps:  To keep the vagina moist.  To make a clear fluid. This clear fluid helps: ? To make the vagina ready  for sex. ? To protect the vagina from infection. If the lining of the vagina is dry and thin, it may cause irritation, burning, or itchiness. It may also:  Make sex painful.  Make an exam of your vagina painful.  Cause bleeding.  Make you lose interest in sex.  Cause a burning feeling when you pee (urinate).  Cause a brown or yellow fluid to come from your vagina. Some women do not have symptoms. Follow these instructions at home: Medicines  Take over-the-counter and prescription medicines only as told by your doctor.  Do not use herbs or other medicines unless your doctor says it is okay.  Use medicines for for dryness. These include: ? Oils to make the vagina soft. ? Creams. ? Moisturizers. General instructions  Do not douche.  Do not use products that can make your vagina dry. These include: ? Scented sprays. ? Scented tampons. ? Scented soaps.  Sex can help increase blood flow and soften the tissue in the vagina. If it hurts to have sex: ? Tell your partner. ? Use products to make sex more comfortable. Use these only as told by your doctor. Contact a doctor if you:  Have discharge from the vagina that is different than usual.  Have a bad smell coming from your vagina.  Have new symptoms.  Do not get better.  Get worse. Summary  Atrophic vaginitis is a condition in which the lining of the vagina becomes dry and thin.  This condition affects women who have stopped having their periods.  Treatment may include using products that help make the vagina soft.  Call a doctor if do not get better with treatment. This information is not intended to replace advice given to you by your health care provider. Make sure you discuss any questions you have with your health care provider. Document Revised: 05/04/2017 Document Reviewed: 05/04/2017 Elsevier Patient Education  2020 [employer].

## 2019-06-06 DIAGNOSIS — J301 Allergic rhinitis due to pollen: Secondary | ICD-10-CM | POA: Diagnosis not present

## 2019-06-06 DIAGNOSIS — Z91018 Allergy to other foods: Secondary | ICD-10-CM | POA: Diagnosis not present

## 2019-06-06 DIAGNOSIS — K219 Gastro-esophageal reflux disease without esophagitis: Secondary | ICD-10-CM | POA: Diagnosis not present

## 2019-06-06 DIAGNOSIS — K200 Eosinophilic esophagitis: Secondary | ICD-10-CM | POA: Diagnosis not present

## 2019-06-09 DIAGNOSIS — F4323 Adjustment disorder with mixed anxiety and depressed mood: Secondary | ICD-10-CM | POA: Diagnosis not present

## 2019-06-10 DIAGNOSIS — J301 Allergic rhinitis due to pollen: Secondary | ICD-10-CM | POA: Diagnosis not present

## 2019-06-16 DIAGNOSIS — K219 Gastro-esophageal reflux disease without esophagitis: Secondary | ICD-10-CM | POA: Diagnosis not present

## 2019-06-16 DIAGNOSIS — E738 Other lactose intolerance: Secondary | ICD-10-CM | POA: Diagnosis not present

## 2019-06-20 DIAGNOSIS — J301 Allergic rhinitis due to pollen: Secondary | ICD-10-CM | POA: Diagnosis not present

## 2019-06-24 DIAGNOSIS — F4323 Adjustment disorder with mixed anxiety and depressed mood: Secondary | ICD-10-CM | POA: Diagnosis not present

## 2019-07-04 DIAGNOSIS — J301 Allergic rhinitis due to pollen: Secondary | ICD-10-CM | POA: Diagnosis not present

## 2019-07-05 DIAGNOSIS — K219 Gastro-esophageal reflux disease without esophagitis: Secondary | ICD-10-CM | POA: Diagnosis not present

## 2019-07-05 DIAGNOSIS — E738 Other lactose intolerance: Secondary | ICD-10-CM | POA: Diagnosis not present

## 2019-07-12 DIAGNOSIS — H2513 Age-related nuclear cataract, bilateral: Secondary | ICD-10-CM | POA: Diagnosis not present

## 2019-07-12 DIAGNOSIS — H11153 Pinguecula, bilateral: Secondary | ICD-10-CM | POA: Diagnosis not present

## 2019-07-12 DIAGNOSIS — H43811 Vitreous degeneration, right eye: Secondary | ICD-10-CM | POA: Diagnosis not present

## 2019-07-21 DIAGNOSIS — J301 Allergic rhinitis due to pollen: Secondary | ICD-10-CM | POA: Diagnosis not present

## 2019-07-21 DIAGNOSIS — F4323 Adjustment disorder with mixed anxiety and depressed mood: Secondary | ICD-10-CM | POA: Diagnosis not present

## 2019-07-26 ENCOUNTER — Encounter: Payer: Self-pay | Admitting: Certified Nurse Midwife

## 2019-07-30 ENCOUNTER — Ambulatory Visit: Payer: BC Managed Care – PPO | Attending: Internal Medicine

## 2019-07-30 NOTE — Progress Notes (Signed)
   [facility]  Name:  EVOLETTE PENDELL    MRN: [id number] DOB: November 25, 1960  07/30/2019  Ms. Fellman was observed post Covid-19 immunization for 15 minutes without incident. She was provided with Vaccine Information Sheet and instruction to access the V-Safe system.   Ms. Yearick was instructed to call 911 with any severe reactions post vaccine: Marland Kitchen Difficulty breathing  . Swelling of face and throat  . A fast heartbeat  . A bad rash all over body  . Dizziness and weakness

## 2019-08-10 DIAGNOSIS — F4323 Adjustment disorder with mixed anxiety and depressed mood: Secondary | ICD-10-CM | POA: Diagnosis not present

## 2019-08-10 DIAGNOSIS — J301 Allergic rhinitis due to pollen: Secondary | ICD-10-CM | POA: Diagnosis not present

## 2019-08-12 DIAGNOSIS — J301 Allergic rhinitis due to pollen: Secondary | ICD-10-CM | POA: Diagnosis not present

## 2019-08-15 DIAGNOSIS — J301 Allergic rhinitis due to pollen: Secondary | ICD-10-CM | POA: Diagnosis not present

## 2019-08-24 DIAGNOSIS — J301 Allergic rhinitis due to pollen: Secondary | ICD-10-CM | POA: Diagnosis not present

## 2019-08-26 DIAGNOSIS — J301 Allergic rhinitis due to pollen: Secondary | ICD-10-CM | POA: Diagnosis not present

## 2019-08-29 ENCOUNTER — Other Ambulatory Visit: Payer: Self-pay

## 2019-08-29 DIAGNOSIS — J301 Allergic rhinitis due to pollen: Secondary | ICD-10-CM | POA: Diagnosis not present

## 2019-08-30 ENCOUNTER — Ambulatory Visit: Payer: BC Managed Care – PPO | Admitting: Obstetrics and Gynecology

## 2019-08-30 ENCOUNTER — Ambulatory Visit: Payer: BC Managed Care – PPO | Admitting: Certified Nurse Midwife

## 2019-08-31 DIAGNOSIS — J301 Allergic rhinitis due to pollen: Secondary | ICD-10-CM | POA: Diagnosis not present

## 2019-09-01 ENCOUNTER — Encounter: Payer: Self-pay | Admitting: Obstetrics and Gynecology

## 2019-09-01 ENCOUNTER — Ambulatory Visit (INDEPENDENT_AMBULATORY_CARE_PROVIDER_SITE_OTHER): Payer: BC Managed Care – PPO | Admitting: Obstetrics and Gynecology

## 2019-09-01 ENCOUNTER — Other Ambulatory Visit: Payer: Self-pay

## 2019-09-01 VITALS — BP 100/62 | HR 63 | Temp 97.9°F | Ht 63.5 in | Wt 119.0 lb

## 2019-09-01 DIAGNOSIS — R350 Frequency of micturition: Secondary | ICD-10-CM

## 2019-09-01 DIAGNOSIS — N952 Postmenopausal atrophic vaginitis: Secondary | ICD-10-CM

## 2019-09-01 DIAGNOSIS — N3090 Cystitis, unspecified without hematuria: Secondary | ICD-10-CM

## 2019-09-01 LAB — POCT URINALYSIS DIPSTICK
Bilirubin, UA: NEGATIVE
Blood, UA: NEGATIVE
Glucose, UA: NEGATIVE
Ketones, UA: NEGATIVE
Nitrite, UA: NEGATIVE
Protein, UA: NEGATIVE
Urobilinogen, UA: NEGATIVE E.U./dL — AB
pH, UA: 7 (ref 5.0–8.0)

## 2019-09-01 MED ORDER — FOSFOMYCIN TROMETHAMINE 3 G PO PACK: 3.0000 g | PACK | Freq: Once | ORAL | 0 refills | Status: AC

## 2019-09-01 NOTE — Progress Notes (Signed)
GYNECOLOGY  VISIT   HPI: 59 y.o.   Married Declined Not Hispanic or Latino  female   [phone] with Patient's last menstrual period was 09/28/2008 (exact date).   here for e-string removal. She can place it, but can't get it out.  She is also having urgency and frequency of urination for the last 3 days. Voiding small amounts, no dysuria. Slight supra pubic discomfort.   GYNECOLOGIC HISTORY: Patient's last menstrual period was 09/28/2008 (exact date). Contraception:none  Menopausal hormone therapy: estring        OB History    Gravida  2   Para  2   Term  2   Preterm  0   AB  0   Living  2     SAB  0   TAB  0   Ectopic  0   Multiple  0   Live Births  2              Patient Active Problem List   Diagnosis Date Noted  . Closed fracture of distal end of radius 03/25/2018  . Other pancytopenia (HCC) 11/09/2014  . 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 Anemia   . Anxiety    with air travel   . Dysmenorrhea   . H/O renal calculi   . Mastalgia   . Ovarian cyst     Past Surgical History:  Procedure Laterality Date  . COLONOSCOPY  10/25/10   negative recheckin 10 years  . SKIN BIOPSY    . WISDOM TOOTH EXTRACTION      Current Outpatient Medications  Medication Sig Dispense Refill  . cetirizine (ZYRTEC) 10 MG tablet Take 10 mg by mouth daily.    . clindamycin (CLEOCIN T) 1 % lotion APPLY DAILY TO THE FACE    . DEXILANT 60 MG capsule Take 1 capsule by mouth daily.    Marland Kitchen estradiol (ESTRING) 2 MG vaginal ring Place 2 mg vaginally every 3 (three) months. Insert a new ring into vagina every 3 months 1 each 4  . Probiotic Product (PROBIOTIC PO) Take by mouth.    Marland Kitchen UNABLE TO FIND Allergy Injections once a month     No current facility-administered medications for this visit.     ALLERGIES: Corn-containing products and Sulfa antibiotics  Family History  Problem Relation Age of Onset  . Hypertension Mother    . Thyroid disease Mother   . Heart failure Maternal Grandmother   . Stroke Paternal Grandmother   . Heart failure Paternal Uncle     Social History   Socioeconomic History  . Marital status: Married    Spouse name: Not on file  . Number of children: Not on file  . Years of education: Not on file  . Highest education level: Not on file  Occupational History  . Not on file  Tobacco Use  . Smoking status: Never Smoker  . Smokeless tobacco: Never Used  Substance and Sexual Activity  . Alcohol use: Yes    Alcohol/week: 0.0 - 1.0 standard drinks    Comment: rarely  . Drug use: No  . Sexual activity: Yes    Partners: Male    Birth control/protection: Post-menopausal  Other Topics Concern  . Not on file  Social History Narrative  . Not on file   Social Determinants of Health   Financial Resource Strain:   . Difficulty of Paying Living Expenses:   Food Insecurity:   . Worried  About Running Out of Food in the Last Year:   . [place] in the Last Year:   Transportation Needs:   . Lack of Transportation (Medical):   Marland Kitchen Lack of Transportation (Non-Medical):   Physical Activity:   . Days of Exercise per Week:   . Minutes of Exercise per Session:   Stress:   . Feeling of Stress :   Social Connections:   . Frequency of Communication with Friends and Family:   . Frequency of Social Gatherings with Friends and Family:   . Attends Religious Services:   . Active Member of Clubs or Organizations:   . Attends Archivist Meetings:   Marland Kitchen Marital Status:   Intimate Partner Violence:   . Fear of Current or Ex-Partner:   . Emotionally Abused:   Marland Kitchen Physically Abused:   . Sexually Abused:     Review of Systems  Genitourinary: Positive for frequency and urgency.  All other systems reviewed and are negative.   PHYSICAL EXAMINATION:    LMP 09/28/2008 (Exact Date)     General appearance: alert, cooperative and appears stated age   Pelvic: External genitalia:  no  lesions              Urethra:  normal appearing urethra with no masses, tenderness or lesions              Bartholins and Skenes: normal                 Vagina: estring removed              Chaperone was present for exam.  Urine dip 2+ leuk  ASSESSMENT Cystitis Vaginal atrophy, can't remove her estring    PLAN Send urine for ua, c&s Treat with fosfomycin x 1 Azo if needed Call if not better in 24-48 hours.  Estring removed

## 2019-09-01 NOTE — Patient Instructions (Signed)

## 2019-09-02 ENCOUNTER — Telehealth: Payer: Self-pay | Admitting: *Deleted

## 2019-09-02 LAB — URINALYSIS, MICROSCOPIC ONLY
Casts: NONE SEEN /lpf
RBC, Urine: NONE SEEN /hpf (ref 0–2)

## 2019-09-02 NOTE — Telephone Encounter (Signed)
Incoming fax received from [facility] stating monurol 3gm packets is not covered by insurance. A covered alternative may be available. Covered formulary alternatives may include: fosfomycin tromethamine, PA not required. Nitrofuratoin monohyd/M-cryst, PA not required. Suldamethoxazole- trimethoprim 800-160 Mg tab, PA not required.  Routing to covering provider for review.   Cc Dr. Oscar La

## 2019-09-02 NOTE — Telephone Encounter (Signed)
Ok to treat with macrobid 100mg  bid x 5 days.  Thanks.

## 2019-09-02 NOTE — Telephone Encounter (Signed)
Call to patient. Message given to patient as seen below. Patient states that she has already picked up the prescription for fosfomycin and taken as prescribed. RN advised would update provider. Patient verbalized understanding and agreeable.   Routing to provider and will close encounter.

## 2019-09-03 LAB — URINE CULTURE

## 2019-09-08 DIAGNOSIS — J301 Allergic rhinitis due to pollen: Secondary | ICD-10-CM | POA: Diagnosis not present

## 2019-09-13 DIAGNOSIS — J301 Allergic rhinitis due to pollen: Secondary | ICD-10-CM | POA: Diagnosis not present

## 2019-09-15 DIAGNOSIS — J3089 Other allergic rhinitis: Secondary | ICD-10-CM | POA: Diagnosis not present

## 2019-09-15 DIAGNOSIS — J301 Allergic rhinitis due to pollen: Secondary | ICD-10-CM | POA: Diagnosis not present

## 2019-09-15 DIAGNOSIS — F4323 Adjustment disorder with mixed anxiety and depressed mood: Secondary | ICD-10-CM | POA: Diagnosis not present

## 2019-09-19 DIAGNOSIS — M9903 Segmental and somatic dysfunction of lumbar region: Secondary | ICD-10-CM | POA: Diagnosis not present

## 2019-09-19 DIAGNOSIS — M7042 Prepatellar bursitis, left knee: Secondary | ICD-10-CM | POA: Diagnosis not present

## 2019-09-19 DIAGNOSIS — M9902 Segmental and somatic dysfunction of thoracic region: Secondary | ICD-10-CM | POA: Diagnosis not present

## 2019-09-19 DIAGNOSIS — M9905 Segmental and somatic dysfunction of pelvic region: Secondary | ICD-10-CM | POA: Diagnosis not present

## 2019-09-19 DIAGNOSIS — J301 Allergic rhinitis due to pollen: Secondary | ICD-10-CM | POA: Diagnosis not present

## 2019-09-21 DIAGNOSIS — J301 Allergic rhinitis due to pollen: Secondary | ICD-10-CM | POA: Diagnosis not present

## 2019-10-04 DIAGNOSIS — J301 Allergic rhinitis due to pollen: Secondary | ICD-10-CM | POA: Diagnosis not present

## 2019-10-05 DIAGNOSIS — F4323 Adjustment disorder with mixed anxiety and depressed mood: Secondary | ICD-10-CM | POA: Diagnosis not present

## 2019-10-07 DIAGNOSIS — J301 Allergic rhinitis due to pollen: Secondary | ICD-10-CM | POA: Diagnosis not present

## 2019-10-11 DIAGNOSIS — J301 Allergic rhinitis due to pollen: Secondary | ICD-10-CM | POA: Diagnosis not present

## 2019-10-12 DIAGNOSIS — M9903 Segmental and somatic dysfunction of lumbar region: Secondary | ICD-10-CM | POA: Diagnosis not present

## 2019-10-12 DIAGNOSIS — M9902 Segmental and somatic dysfunction of thoracic region: Secondary | ICD-10-CM | POA: Diagnosis not present

## 2019-10-12 DIAGNOSIS — M7042 Prepatellar bursitis, left knee: Secondary | ICD-10-CM | POA: Diagnosis not present

## 2019-10-12 DIAGNOSIS — M9905 Segmental and somatic dysfunction of pelvic region: Secondary | ICD-10-CM | POA: Diagnosis not present

## 2019-10-24 DIAGNOSIS — J301 Allergic rhinitis due to pollen: Secondary | ICD-10-CM | POA: Diagnosis not present

## 2019-10-24 DIAGNOSIS — J3089 Other allergic rhinitis: Secondary | ICD-10-CM | POA: Diagnosis not present

## 2019-10-31 DIAGNOSIS — F4323 Adjustment disorder with mixed anxiety and depressed mood: Secondary | ICD-10-CM | POA: Diagnosis not present

## 2019-11-08 DIAGNOSIS — M9902 Segmental and somatic dysfunction of thoracic region: Secondary | ICD-10-CM | POA: Diagnosis not present

## 2019-11-08 DIAGNOSIS — M7042 Prepatellar bursitis, left knee: Secondary | ICD-10-CM | POA: Diagnosis not present

## 2019-11-08 DIAGNOSIS — M9903 Segmental and somatic dysfunction of lumbar region: Secondary | ICD-10-CM | POA: Diagnosis not present

## 2019-11-08 DIAGNOSIS — M9905 Segmental and somatic dysfunction of pelvic region: Secondary | ICD-10-CM | POA: Diagnosis not present

## 2019-11-09 DIAGNOSIS — J301 Allergic rhinitis due to pollen: Secondary | ICD-10-CM | POA: Diagnosis not present

## 2019-11-17 DIAGNOSIS — M9905 Segmental and somatic dysfunction of pelvic region: Secondary | ICD-10-CM | POA: Diagnosis not present

## 2019-11-17 DIAGNOSIS — M9902 Segmental and somatic dysfunction of thoracic region: Secondary | ICD-10-CM | POA: Diagnosis not present

## 2019-11-17 DIAGNOSIS — M9903 Segmental and somatic dysfunction of lumbar region: Secondary | ICD-10-CM | POA: Diagnosis not present

## 2019-11-17 DIAGNOSIS — M7042 Prepatellar bursitis, left knee: Secondary | ICD-10-CM | POA: Diagnosis not present

## 2019-11-21 DIAGNOSIS — F4323 Adjustment disorder with mixed anxiety and depressed mood: Secondary | ICD-10-CM | POA: Diagnosis not present

## 2019-11-22 DIAGNOSIS — J3089 Other allergic rhinitis: Secondary | ICD-10-CM | POA: Diagnosis not present

## 2019-11-22 DIAGNOSIS — J301 Allergic rhinitis due to pollen: Secondary | ICD-10-CM | POA: Diagnosis not present

## 2019-11-28 ENCOUNTER — Ambulatory Visit: Payer: BC Managed Care – PPO | Admitting: Certified Nurse Midwife

## 2019-12-02 ENCOUNTER — Other Ambulatory Visit: Payer: Self-pay | Admitting: Obstetrics and Gynecology

## 2019-12-02 DIAGNOSIS — Z1231 Encounter for screening mammogram for malignant neoplasm of breast: Secondary | ICD-10-CM

## 2019-12-05 DIAGNOSIS — J3089 Other allergic rhinitis: Secondary | ICD-10-CM | POA: Diagnosis not present

## 2019-12-05 DIAGNOSIS — J301 Allergic rhinitis due to pollen: Secondary | ICD-10-CM | POA: Diagnosis not present

## 2019-12-06 ENCOUNTER — Other Ambulatory Visit: Payer: Self-pay

## 2019-12-06 ENCOUNTER — Ambulatory Visit
Admission: RE | Admit: 2019-12-06 | Discharge: 2019-12-06 | Disposition: A | Discharge status: Home / Self Care | Payer: BC Managed Care – PPO | Source: Ambulatory Visit

## 2019-12-06 DIAGNOSIS — Z1231 Encounter for screening mammogram for malignant neoplasm of breast: Secondary | ICD-10-CM

## 2019-12-07 ENCOUNTER — Encounter: Payer: Self-pay | Admitting: Obstetrics and Gynecology

## 2019-12-07 ENCOUNTER — Ambulatory Visit (INDEPENDENT_AMBULATORY_CARE_PROVIDER_SITE_OTHER): Payer: BC Managed Care – PPO | Admitting: Obstetrics and Gynecology

## 2019-12-07 VITALS — BP 100/60 | HR 66 | Ht 62.0 in | Wt 120.0 lb

## 2019-12-07 DIAGNOSIS — Z0000 Encounter for general adult medical examination without abnormal findings: Secondary | ICD-10-CM

## 2019-12-07 DIAGNOSIS — N952 Postmenopausal atrophic vaginitis: Secondary | ICD-10-CM

## 2019-12-07 DIAGNOSIS — M816 Localized osteoporosis [Lequesne]: Secondary | ICD-10-CM

## 2019-12-07 DIAGNOSIS — Z01419 Encounter for gynecological examination (general) (routine) without abnormal findings: Secondary | ICD-10-CM | POA: Diagnosis not present

## 2019-12-07 DIAGNOSIS — Z124 Encounter for screening for malignant neoplasm of cervix: Secondary | ICD-10-CM

## 2019-12-07 MED ORDER — ESTRADIOL 2 MG VA RING: 2.0000 mg | VAGINAL_RING | VAGINAL | 4 refills | Status: DC

## 2019-12-07 NOTE — Progress Notes (Signed)
59 y.o. [id number] Married Declined Not Hispanic or Latino female here for annual exam.  Would like to go over her bmd from 2020. She never got to go over it with anyone.   No vaginal bleeding. Uses the estring, needs it removed today. Sexually active, no pain with lubrication.   Husband has parkinson's and dementia, good days and bad days. Have had a great marriage. She is also helping to take care of 67 year old mother, lives on her own. Dad died during covid.  Under lots of stress.   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 She exercises a lot.     Patient's last menstrual period was 09/28/2008 (exact date).          Sexually active: Yes.    The current method of family planning is post menopausal status.    Exercising: Yes.    Running, biking, yoga, weight liting Smoker:  no  Health Maintenance: Pap:   11-18-17 neg HPV HR neg, 10/04/14 WNL HPV Neg  History of abnormal Pap:  no MMG:  Had done yesterday. Not yet resulted BMD:   12/21/18 osteoporotic, T score of spine -2.6 Colonoscopy: 2012 f/u 106yrs, endoscopy  TDaP:  2016  Gardasil: NA   reports that she has never smoked. She has never used smokeless tobacco. She reports current alcohol use. She reports that she does not use drugs. Rare ETOH. 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   Past Medical History:  Diagnosis Date  . Anemia   . Anxiety    with air travel   . Dysmenorrhea   . H/O renal calculi   . Mastalgia   . Ovarian cyst     Past Surgical History:  Procedure Laterality Date  . COLONOSCOPY  10/25/10   negative recheckin 10 years  . SKIN BIOPSY    . WISDOM TOOTH EXTRACTION      Current Outpatient Medications  Medication Sig Dispense Refill  . cetirizine (ZYRTEC) 10 MG tablet Take 10 mg by mouth daily.    . clindamycin (CLEOCIN T) 1 % lotion APPLY DAILY TO THE FACE    . DEXILANT 60 MG capsule Take 1 capsule by mouth daily.    Marland Kitchen estradiol (ESTRING) 2 MG vaginal ring Place 2 mg vaginally every  3 (three) months. Insert a new ring into vagina every 3 months 1 each 4  . Probiotic Product (PROBIOTIC PO) Take by mouth.    Marland Kitchen UNABLE TO FIND Allergy Injections once a month     No current facility-administered medications for this visit.    Family History  Problem Relation Age of Onset  . Hypertension Mother   . Thyroid disease Mother   . Heart failure Maternal Grandmother   . Stroke Paternal Grandmother   . Heart failure Paternal Uncle     Review of Systems  Psychiatric/Behavioral: Positive for dysphoric mood. The patient is nervous/anxious.   All other systems reviewed and are negative.   Exam:   LMP 09/28/2008 (Exact Date) Comment: has E-String  Weight change: @WEIGHTCHANGE @ Height:      Ht Readings from Last 3 Encounters:  09/01/19 5' 3.5" (1.613 m)  11/24/18 5' 2.5" (1.588 m)  11/18/17 5' 2.5" (1.588 m)    General appearance: alert, cooperative and appears stated age Head: Normocephalic, without obvious abnormality, atraumatic Neck: no adenopathy, supple, symmetrical, trachea midline and thyroid normal to inspection and palpation Lungs: clear to auscultation bilaterally Cardiovascular: regular rate and rhythm Breasts: normal appearance, no masses or  tenderness Abdomen: soft, non-tender; non distended,  no masses,  no organomegaly Extremities: extremities normal, atraumatic, no cyanosis or edema Skin: Skin color, texture, turgor normal. No rashes or lesions Lymph nodes: Cervical, supraclavicular, and axillary nodes normal. No abnormal inguinal nodes palpated Neurologic: Grossly normal   Pelvic: External genitalia:  no lesions              Urethra:  normal appearing urethra with no masses, tenderness or lesions              Bartholins and Skenes: normal                 Vagina: estring removed and discarded. Atrophic appearing vagina with normal color and discharge, no lesions              Cervix: no lesions               Bimanual Exam:  Uterus:  normal size,  contour, position, consistency, mobility, non-tender              Adnexa: no mass, fullness, tenderness               Rectovaginal: Confirms               Anus:  normal sphincter tone, no lesions  Carolynn Serve chaperoned for the exam.  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

## 2019-12-07 NOTE — Patient Instructions (Signed)

## 2019-12-08 ENCOUNTER — Telehealth: Payer: Self-pay

## 2019-12-08 LAB — COMPREHENSIVE METABOLIC PANEL
ALT: 19 IU/L (ref 0–32)
AST: 29 IU/L (ref 0–40)
Albumin/Globulin Ratio: 2 (ref 1.2–2.2)
Albumin: 4.7 g/dL (ref 3.8–4.9)
Alkaline Phosphatase: 86 IU/L (ref 48–121)
BUN/Creatinine Ratio: 16 (ref 12–28)
BUN: 13 mg/dL (ref 8–27)
Bilirubin Total: 0.3 mg/dL (ref 0.0–1.2)
CO2: 27 mmol/L (ref 20–29)
Calcium: 9.8 mg/dL (ref 8.7–10.3)
Chloride: 102 mmol/L (ref 96–106)
Creatinine, Ser: 0.79 mg/dL (ref 0.57–1.00)
GFR calc Af Amer: 94 mL/min/{1.73_m2} (ref >59)
GFR calc non Af Amer: 82 mL/min/{1.73_m2} (ref >59)
Globulin, Total: 2.4 g/dL (ref 1.5–4.5)
Glucose: 95 mg/dL (ref 65–99)
Potassium: 5.3 mmol/L — ABNORMAL HIGH (ref 3.5–5.2)
Sodium: 141 mmol/L (ref 134–144)
Total Protein: 7.1 g/dL (ref 6.0–8.5)

## 2019-12-08 LAB — LIPID PANEL
Chol/HDL Ratio: 3.1 ratio (ref 0.0–4.4)
Cholesterol, Total: 232 mg/dL — ABNORMAL HIGH (ref 100–199)
HDL: 76 mg/dL (ref >39)
LDL Chol Calc (NIH): 137 mg/dL — ABNORMAL HIGH (ref 0–99)
Triglycerides: 110 mg/dL (ref 0–149)
VLDL Cholesterol Cal: 19 mg/dL (ref 5–40)

## 2019-12-08 LAB — CBC
Hematocrit: 37.7 % (ref 34.0–46.6)
Hemoglobin: 12.7 g/dL (ref 11.1–15.9)
MCH: 31.2 pg (ref 26.6–33.0)
MCHC: 33.7 g/dL (ref 31.5–35.7)
MCV: 93 fL (ref 79–97)
Platelets: 176 10*3/uL (ref 150–450)
RBC: 4.07 x10E6/uL (ref 3.77–5.28)
RDW: 13.4 % (ref 11.7–15.4)
WBC: 4.9 10*3/uL (ref 3.4–10.8)

## 2019-12-08 LAB — VITAMIN D 25 HYDROXY (VIT D DEFICIENCY, FRACTURES): Vit D, 25-Hydroxy: 32.7 ng/mL (ref 30.0–100.0)

## 2019-12-08 NOTE — Telephone Encounter (Signed)
Patient is calling needing a referral to endocrinologist.

## 2019-12-08 NOTE — Telephone Encounter (Signed)
Spoke with pt. Pt wanting to know about an endocrinology referral. Pt updated on referral placed yesterday by Dr Oscar La. Pt informed that Dr Elvera Lennox is out of the office at this time, but will be called by there office for referral appt. Pt agreeable and verbalized understanding.   Cc: Rosa for update  Encounter closed.   General 12/08/2019 9:34 AM Wendy Medina, CMA - -  Note   Dr. Elvera Lennox is out of the office-will defer until she returns

## 2019-12-13 ENCOUNTER — Other Ambulatory Visit (HOSPITAL_COMMUNITY)
Admission: RE | Admit: 2019-12-13 | Discharge: 2019-12-13 | Disposition: A | Discharge status: Home / Self Care | Payer: BC Managed Care – PPO | Source: Ambulatory Visit | Attending: Obstetrics and Gynecology | Admitting: Obstetrics and Gynecology

## 2019-12-13 DIAGNOSIS — Z124 Encounter for screening for malignant neoplasm of cervix: Secondary | ICD-10-CM | POA: Diagnosis not present

## 2019-12-13 DIAGNOSIS — J301 Allergic rhinitis due to pollen: Secondary | ICD-10-CM | POA: Diagnosis not present

## 2019-12-13 NOTE — Addendum Note (Signed)
Addended by: Tobi Bastos on: 12/13/2019 09:11 AM   Modules accepted: Orders

## 2019-12-14 DIAGNOSIS — F4323 Adjustment disorder with mixed anxiety and depressed mood: Secondary | ICD-10-CM | POA: Diagnosis not present

## 2019-12-14 LAB — CYTOLOGY - PAP
Comment: NEGATIVE
Diagnosis: NEGATIVE
High risk HPV: NEGATIVE

## 2019-12-15 DIAGNOSIS — M9905 Segmental and somatic dysfunction of pelvic region: Secondary | ICD-10-CM | POA: Diagnosis not present

## 2019-12-15 DIAGNOSIS — M9903 Segmental and somatic dysfunction of lumbar region: Secondary | ICD-10-CM | POA: Diagnosis not present

## 2019-12-15 DIAGNOSIS — M7042 Prepatellar bursitis, left knee: Secondary | ICD-10-CM | POA: Diagnosis not present

## 2019-12-15 DIAGNOSIS — M9902 Segmental and somatic dysfunction of thoracic region: Secondary | ICD-10-CM | POA: Diagnosis not present

## 2019-12-20 DIAGNOSIS — J301 Allergic rhinitis due to pollen: Secondary | ICD-10-CM | POA: Diagnosis not present

## 2019-12-20 DIAGNOSIS — J3089 Other allergic rhinitis: Secondary | ICD-10-CM | POA: Diagnosis not present

## 2019-12-26 DIAGNOSIS — J3089 Other allergic rhinitis: Secondary | ICD-10-CM | POA: Diagnosis not present

## 2019-12-26 DIAGNOSIS — J301 Allergic rhinitis due to pollen: Secondary | ICD-10-CM | POA: Diagnosis not present

## 2019-12-26 DIAGNOSIS — Z20822 Contact with and (suspected) exposure to covid-19: Secondary | ICD-10-CM | POA: Diagnosis not present

## 2020-01-12 DIAGNOSIS — J301 Allergic rhinitis due to pollen: Secondary | ICD-10-CM | POA: Diagnosis not present

## 2020-01-13 DIAGNOSIS — F4323 Adjustment disorder with mixed anxiety and depressed mood: Secondary | ICD-10-CM | POA: Diagnosis not present

## 2020-01-16 DIAGNOSIS — J301 Allergic rhinitis due to pollen: Secondary | ICD-10-CM | POA: Diagnosis not present

## 2020-01-16 DIAGNOSIS — J3089 Other allergic rhinitis: Secondary | ICD-10-CM | POA: Diagnosis not present

## 2020-01-23 DIAGNOSIS — J301 Allergic rhinitis due to pollen: Secondary | ICD-10-CM | POA: Diagnosis not present

## 2020-01-26 DIAGNOSIS — R7982 Elevated C-reactive protein (CRP): Secondary | ICD-10-CM | POA: Diagnosis not present

## 2020-01-26 DIAGNOSIS — E7211 Homocystinuria: Secondary | ICD-10-CM | POA: Diagnosis not present

## 2020-01-26 DIAGNOSIS — R5383 Other fatigue: Secondary | ICD-10-CM | POA: Diagnosis not present

## 2020-01-26 DIAGNOSIS — D550 Anemia due to glucose-6-phosphate dehydrogenase [G6PD] deficiency: Secondary | ICD-10-CM | POA: Diagnosis not present

## 2020-01-30 DIAGNOSIS — J301 Allergic rhinitis due to pollen: Secondary | ICD-10-CM | POA: Diagnosis not present

## 2020-01-30 DIAGNOSIS — F4323 Adjustment disorder with mixed anxiety and depressed mood: Secondary | ICD-10-CM | POA: Diagnosis not present

## 2020-02-01 ENCOUNTER — Encounter: Payer: Self-pay | Admitting: Internal Medicine

## 2020-02-01 ENCOUNTER — Ambulatory Visit: Payer: BC Managed Care – PPO | Admitting: Internal Medicine

## 2020-02-01 ENCOUNTER — Other Ambulatory Visit: Payer: Self-pay

## 2020-02-01 VITALS — BP 90/60 | HR 76 | Ht 62.0 in | Wt 117.0 lb

## 2020-02-01 DIAGNOSIS — M810 Age-related osteoporosis without current pathological fracture: Secondary | ICD-10-CM | POA: Insufficient documentation

## 2020-02-01 NOTE — Patient Instructions (Addendum)
Please continue off vitamin D.  Stop vitamin A.  Please make sure you get 1000-1200 mg calcium, preferentially from the diet  - if you need to supplement, may need 1 tablet Calcium citrate.  Try to avoid BLT exercises (bending, lifting, turning).  We will try to obtain a new bone density scan.  Please come back for a follow-up appointment in 1 year.  How Can I Prevent Falls? Men and women with osteoporosis need to take care not to fall down. Falls can break bones. Some reasons people fall are: Poor vision  Poor balance  Certain diseases that affect how you walk  Some types of medicine, such as sleeping pills.  Some tips to help prevent falls outdoors are: Use a cane or walker  Wear rubber-soled shoes so you don't slip  Walk on grass when sidewalks are slippery  In winter, put salt or kitty litter on icy sidewalks.  Some ways to help prevent falls indoors are: Keep rooms free of clutter, especially on floors  Use plastic or carpet runners on slippery floors  Wear low-heeled shoes that provide good support  Do not walk in socks, stockings, or slippers  Be sure carpets and area rugs have skid-proof backs or are tacked to the floor  Be sure stairs are well lit and have rails on both sides  Put grab bars on bathroom walls near tub, shower, and toilet  Use a rubber bath mat in the shower or tub  Keep a flashlight next to your bed  Use a sturdy step stool with a handrail and wide steps  Add more lights in rooms (and night lights) Buy a cordless phone to keep with you so that you don't have to rush to the phone       when it rings and so that you can call for help if you fall.   (adapted from http://www.niams.NightlifePreviews.se)  Please check out the following book about best diet for bone health:   Exercise for Strong Bones (from [place]) There are two types of exercises that are important for building and maintaining  bone density:  weight-bearing and muscle-strengthening exercises. Weight-bearing Exercises These exercises include activities that make you move against gravity while staying upright. Weight-bearing exercises can be high-impact or low-impact. High-impact weight-bearing exercises help build bones and keep them strong. If you have broken a bone due to osteoporosis or are at risk of breaking a bone, you may need to avoid high-impact exercises. If you're not sure, you should check with your healthcare provider. Examples of high-impact weight-bearing exercises are: . Dancing . Doing high-impact aerobics . Hiking . Jogging/running . Jumping Rope . Stair climbing . Tennis Low-impact weight-bearing exercises can also help keep bones strong and are a safe alternative if you cannot do high-impact exercises. Examples of low-impact weight-bearing exercises are: . Using elliptical training machines . Doing low-impact aerobics . Using stair-step machines . Fast walking on a treadmill or outside Muscle-Strengthening Exercises These exercises include activities where you move your body, a weight or some other resistance against gravity. They are also known as resistance exercises and include: . Lifting weights . Using elastic exercise bands . Using weight machines . Lifting your own body weight . Functional movements, such as standing and rising up on your toes Yoga and Pilates can also improve strength, balance and flexibility. However, certain positions may not be safe for people with osteoporosis or those at increased risk of broken bones. For example, exercises that have you bend  forward may increase the chance of breaking a bone in the spine. A physical therapist should be able to help you learn which exercises are safe and appropriate for you. Non-Impact Exercises Non-impact exercises can help you to improve balance, posture and how well you move in everyday activities. These exercises can also help  to increase muscle strength and decrease the risk of falls and broken bones. Some of these exercises include: . Balance exercises that strengthen your legs and test your balance, such as Tai Chi, can decrease your risk of falls. . Posture exercises that improve your posture and reduce rounded or "sloping" shoulders can help you decrease the chance of breaking a bone, especially in the spine. . Functional exercises that improve how well you move can help you with everyday activities and decrease your chance of falling and breaking a bone. For example, if you have trouble getting up from a chair or climbing stairs, you should do these activities as exercises. A physical therapist can teach you balance, posture and functional exercises. Starting a New Exercise Program If you haven't exercised regularly for a while, check with your healthcare provider before beginning a new exercise program--particularly if you have health problems such as heart disease, diabetes or high blood pressure. If you're at high risk of breaking a bone, you should work with a physical therapist to develop a safe exercise program. Once you have your healthcare provider's approval, start slowly. If you've already broken bones in the spine because of osteoporosis, be very careful to avoid activities that require reaching down, bending forward, rapid twisting motions, heavy lifting and those that increase your chance of a fall. As you get started, your muscles may feel sore for a day or two after you exercise. If soreness lasts longer, you may be working too hard and need to ease up. Exercises should be done in a pain-free range of motion. How Much Exercise Do You Need? Weight-bearing exercises 30 minutes on most days of the week. Do a 30-minutesession or multiple sessions spread out throughout the day. The benefits to your bones are the same.   Muscle-strengthening exercises Two to three days per week. If you don't have much time for  strengthening/resistance training, do small amounts at a time. You can do just one body part each day. For example do arms one day, legs the next and trunk the next. You can also spread these exercises out during your normal day.  Balance, posture and functional exercises Every day or as often as needed. You may want to focus on one area more than the others. If you have fallen or lose your balance, spend time doing balance exercises. If you are getting rounded shoulders, work more on posture exercises. If you have trouble climbing stairs or getting up from the couch, do more functional exercises. You can also perform these exercises at one time or spread them during your day. Work with a phyiscal therapist to learn the right exercises for you.

## 2020-02-01 NOTE — Progress Notes (Signed)
Patient ID: Wendy Medina, female   DOB: 11-19-1960, 59 y.o.   MRN: [id number]   This visit occurred during the SARS-CoV-2 public health emergency.  Safety protocols were in place, including screening questions prior to the visit, additional usage of staff PPE, and extensive cleaning of exam room while observing appropriate contact time as indicated for disinfecting solutions.   HPI  Wendy Medina is a 59 y.o.-year-old female, referred by her OB/GYN doctor, Dr. Oscar La, for management of osteoporosis (OP).  Pt was dx with OP in 12/2018.  I reviewed pt's DXA scans: Date L1-L4 T score FN T score 33% distal Radius  12/21/2018 ([facility]) L1-L2: -2.6 RFN: -1.8 LFN: -1.4 n/a   No recent falls.  She has a history of right distal radial fracture 03/2018 - slipped and fell in the bathroom.  No dizziness/vertigo/poor vision. + orthostasis - low BP.  On glutathione and vitamins infusion.  Also takes magnesium.  No Previous OP treatments.  No h/o vitamin D deficiency. Reviewed available vit D levels: Lab Results  Component Value Date   VD25OH 32.7 12/07/2019   Pt is on:  - vitamin D liquid 5000-15,000 units daily (recommended daily dose is 1 teaspoon and she is taking between 1 teaspoon and 1 tablespoon)- started 1 mo ago - but just ran out of this  She is on a plant-based diet.  No meat, eggs, dairy.  No weight bearing exercises. She does Pilates, Yoga, biking, tennis, and running -trains for a half marathon. She also  started weight bearing exercises during the pandemic.  She has been exercising daily for the last 2 years.  She does take vitamin A doses - started 1 mo ago.  Menopause was at 59 y/o.  She is on vaginal estrogen.  FH of osteoporosis:  Mother with OP - developed in her 49s. Father broke his hip in his 37s and died soon afterwards.  No h/o hyper/hypocalcemia or hyperparathyroidism. No h/o kidney stones. Lab Results  Component Value Date   CALCIUM 9.8  12/07/2019   CALCIUM 9.9 11/09/2014   CALCIUM 10.0 09/14/2012   No h/o thyrotoxicosis. Reviewed TSH recent levels:  Lab Results  Component Value Date   TSH 2.249 09/14/2012   No h/o CKD. Last BUN/Cr: Lab Results  Component Value Date   BUN 13 12/07/2019   CREATININE 0.79 12/07/2019   She has a history of pancytopenia for which she saw hematology.  She is on Dexilant for GERD - off and on, 3x a week. Had cough 2/2 GERD and allergies. Now controlled.  Her husband has Parkinson's disease, diagnosed 10 years ago.  ROS: Constitutional: no weight gain, no weight loss, no fatigue, no subjective hyperthermia, no subjective hypothermia, no nocturia Eyes: no blurry vision, no xerophthalmia ENT: no sore throat, no nodules palpated in neck, no dysphagia, no odynophagia, no hoarseness, no tinnitus, no hypoacusis Cardiovascular: no CP, no SOB, no palpitations, no leg swelling Respiratory: no cough, no SOB, no wheezing Gastrointestinal: no N, no V, no D, no C, + acid reflux Musculoskeletal: no muscle, no joint aches Skin: no rash, no hair loss Neurological: no tremors, no numbness or tingling/no dizziness/no HAs Psychiatric: no depression, no anxiety  I reviewed pt's medications, allergies, PMH, social hx, family hx, and changes were documented in the history of present illness. Otherwise, unchanged from my initial visit note.  Past Medical History:  Diagnosis Date  . Anemia   . Anxiety    with air travel   . Dysmenorrhea   .  H/O renal calculi   . Mastalgia   . Ovarian cyst    Past Surgical History:  Procedure Laterality Date  . COLONOSCOPY  10/25/10   negative recheckin 10 years  . SKIN BIOPSY    . WISDOM TOOTH EXTRACTION     Social History   Socioeconomic History  . Marital status: Married    Spouse name: Not on file  . Number of children: 2  . Years of education: Not on file  . Highest education level: Not on file  Occupational History  . Occupation: Retired  Tobacco  Use  . Smoking status: Never Smoker  . Smokeless tobacco: Never Used  Substance and Sexual Activity  . Alcohol use: Yes    Alcohol/week: 0.0 - 1.0 standard drinks    Comment: rarely  . Drug use: No  . Sexual activity: Yes    Partners: Male    Birth control/protection: Post-menopausal  Other Topics Concern  . Not on file  Social History Narrative  . Not on file   Social Determinants of Health   Financial Resource Strain:   . Difficulty of Paying Living Expenses: Not on file  Food Insecurity:   . Worried About Programme researcher, broadcasting/film/videounning Out of Food in the Last Year: Not on file  . Ran Out of Food in the Last Year: Not on file  Transportation Needs:   . Lack of Transportation (Medical): Not on file  . Lack of Transportation (Non-Medical): Not on file  Physical Activity:   . Days of Exercise per Week: Not on file  . Minutes of Exercise per Session: Not on file  Stress:   . Feeling of Stress : Not on file  Social Connections:   . Frequency of Communication with Friends and Family: Not on file  . Frequency of Social Gatherings with Friends and Family: Not on file  . Attends Religious Services: Not on file  . Active Member of Clubs or Organizations: Not on file  . Attends BankerClub or Organization Meetings: Not on file  . Marital Status: Not on file  Intimate Partner Violence:   . Fear of Current or Ex-Partner: Not on file  . Emotionally Abused: Not on file  . Physically Abused: Not on file  . Sexually Abused: Not on file   Current Outpatient Medications on File Prior to Visit  Medication Sig Dispense Refill  . cetirizine (ZYRTEC) 10 MG tablet Take 10 mg by mouth daily.    Marland Kitchen. DEXILANT 60 MG capsule Take 1 capsule by mouth daily.    Marland Kitchen. estradiol (ESTRING) 2 MG vaginal ring Place 2 mg vaginally every 3 (three) months. Insert a new ring into vagina every 3 months 1 each 4  . Probiotic Product (PROBIOTIC PO) Take by mouth.    Marland Kitchen. UNABLE TO FIND Allergy Injections once a month     No current  facility-administered medications on file prior to visit.   Allergies  Allergen Reactions  . Corn-Containing Products Shortness Of Breath  . Sulfa Antibiotics    Family History  Problem Relation Age of Onset  . Hypertension Mother   . Thyroid disease Mother   . Hyperlipidemia Mother   . Heart failure Maternal Grandmother   . Stroke Paternal Grandmother   . Heart failure Paternal Uncle     PE: BP 90/60   Pulse 76   Ht 5\' 2"  (1.575 m)   Wt 117 lb (53.1 kg)   LMP 09/28/2008 (Exact Date) Comment: has E-String  SpO2 98%   BMI 21.40 kg/m  Wt Readings from Last 3 Encounters:  02/01/20 117 lb (53.1 kg)  12/07/19 120 lb (54.4 kg)  09/01/19 119 lb (54 kg)   Constitutional: Normal weight, in NAD. No kyphosis. Eyes: PERRLA, EOMI, no exophthalmos ENT: moist mucous membranes, no thyromegaly, no cervical lymphadenopathy Cardiovascular: RRR, No MRG Respiratory: CTA B Gastrointestinal: abdomen soft, NT, ND, BS+ Musculoskeletal: no deformities, strength intact in all 4 Skin: moist, warm, no rashes Neurological: no tremor with outstretched hands, DTR normal in all 4  Assessment: 1. Osteoporosis  Plan: 1. Osteoporosis - likely postmenopausal/age-related, she has FH of OP in mother and father - Discussed about increased risk of fracture, depending on the T score, greatly increased when the T score is lower than -2.5, but it is actually a continuum and -2.5 should not be regarded as an absolute threshold. We reviewed her DXA scan report together, and I explained that based on the T scores, she has an increased risk for fractures, however, she does have low bones and her bone density is likely read lower.  Also, her risk of fracture is actually decreased by her very active lifestyle and her consistent exercise including weightbearing exercises.  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 - we reviewed her dietary and supplemental calcium and vitamin D intake. I recommended to make sure she gets 1000-1200 mg of calcium daily preferentially from diet and I will check vit D today to see if she is not getting too much vitamin D since she was started on a very high dose by an integrative provider (5000-15000 units daily) in the setting of a low normal vitamin D level.  She just ran out of her supplement. - she was also started on vitamin A supplements and I explained that this can exacerbate osteoporosis increase fractures.  I advised her to stop -She is not smoking or drinking alcohol. -She follows a plant-based diet, which is excellent, and I recommended the following book for the concept of low acid eating:  - discussed fall precautions and given list of things that she can do to avoid falls - given handout from [facility] Re: weight bearing exercises - advised to do this every day or at least 5/7 days - we discussed about trying to avoid excessive BLT exercises (bend, lift, turn), however, she has been doing yoga and Pilates for very long time and I do not  feel that she needs to stop these - We discussed about the different medication classes, benefits and side effects (including atypical fractures and ONJ).   -Since she has GERD, she is not a candidate for p.o. bisphosphonates.  In this case, first option for her would be sq denosumab (Prolia) sq every 6 months or zoledronic acid (Reclast) iv 1x a year. I would use Teriparatide/Abaloparatide (which are daily subcu medication) or Romosozumab (monthly) as a last resort.  - I explained the mechanism of action and expected benefits for the above medication classes. - will check a new DXA scan now.  Since the previous, she has introduced weightbearing exercises and I expect the bone density to improve.  - will see pt back in a year  Orders Placed  This Encounter  Procedures  . DG Bone Density  . VITAMIN D 25 Hydroxy (Vit-D Deficiency, Fractures)   Component  Latest Ref Rng & Units 02/01/2020  Vitamin D, 25-Hydroxy     30.0 - 100.0 ng/mL 77.2  Vitamin D is normal, but possibly because she ran out of the high-dose formulation. I will suggest to start 4000 units daily and I would like to repeat her vitamin D level in 2 months.  Carlus Pavlov, MD PhD [facility]

## 2020-02-02 ENCOUNTER — Encounter: Payer: Self-pay | Admitting: Internal Medicine

## 2020-02-02 LAB — VITAMIN D 25 HYDROXY (VIT D DEFICIENCY, FRACTURES): Vit D, 25-Hydroxy: 77.2 ng/mL (ref 30.0–100.0)

## 2020-02-06 DIAGNOSIS — J301 Allergic rhinitis due to pollen: Secondary | ICD-10-CM | POA: Diagnosis not present

## 2020-02-06 DIAGNOSIS — J3089 Other allergic rhinitis: Secondary | ICD-10-CM | POA: Diagnosis not present

## 2020-02-06 DIAGNOSIS — J3081 Allergic rhinitis due to animal (cat) (dog) hair and dander: Secondary | ICD-10-CM | POA: Diagnosis not present

## 2020-02-10 DIAGNOSIS — Z23 Encounter for immunization: Secondary | ICD-10-CM | POA: Diagnosis not present

## 2020-02-13 DIAGNOSIS — J3081 Allergic rhinitis due to animal (cat) (dog) hair and dander: Secondary | ICD-10-CM | POA: Diagnosis not present

## 2020-02-13 DIAGNOSIS — J301 Allergic rhinitis due to pollen: Secondary | ICD-10-CM | POA: Diagnosis not present

## 2020-02-13 DIAGNOSIS — J3089 Other allergic rhinitis: Secondary | ICD-10-CM | POA: Diagnosis not present

## 2020-02-14 DIAGNOSIS — F4322 Adjustment disorder with anxiety: Secondary | ICD-10-CM | POA: Diagnosis not present

## 2020-02-24 DIAGNOSIS — J301 Allergic rhinitis due to pollen: Secondary | ICD-10-CM | POA: Diagnosis not present

## 2020-02-27 DIAGNOSIS — J301 Allergic rhinitis due to pollen: Secondary | ICD-10-CM | POA: Diagnosis not present

## 2020-02-28 DIAGNOSIS — J301 Allergic rhinitis due to pollen: Secondary | ICD-10-CM | POA: Diagnosis not present

## 2020-03-05 DIAGNOSIS — F4322 Adjustment disorder with anxiety: Secondary | ICD-10-CM | POA: Diagnosis not present

## 2020-03-05 DIAGNOSIS — J301 Allergic rhinitis due to pollen: Secondary | ICD-10-CM | POA: Diagnosis not present

## 2020-03-06 ENCOUNTER — Telehealth: Payer: Self-pay

## 2020-03-06 DIAGNOSIS — L738 Other specified follicular disorders: Secondary | ICD-10-CM | POA: Diagnosis not present

## 2020-03-06 DIAGNOSIS — D224 Melanocytic nevi of scalp and neck: Secondary | ICD-10-CM | POA: Diagnosis not present

## 2020-03-06 DIAGNOSIS — D225 Melanocytic nevi of trunk: Secondary | ICD-10-CM | POA: Diagnosis not present

## 2020-03-06 DIAGNOSIS — L814 Other melanin hyperpigmentation: Secondary | ICD-10-CM | POA: Diagnosis not present

## 2020-03-06 NOTE — Telephone Encounter (Signed)
Patient is calling wanting to get Estring removed. Patient states she can receive a Clinical cytogeneticist message.

## 2020-03-06 NOTE — Telephone Encounter (Signed)
Left message for pt to return call to triage RN. 

## 2020-03-07 NOTE — Telephone Encounter (Signed)
Spoke with pt. Pt states needing appointment for Estring ring removal. Pt states placed 3 months ago and is due to take out this week. Pt states had Debbie, CNM take out every 3 months in the past. Pt states cannot get it out. Pt scheduled with Tresa Endo, NP 11/8 at 8 am. Pt agreeable to date and time of appt. Pt offered earlier appts with Dr Oscar La and Tresa Endo, NP, pt declined.  Routing to [place], NP for review Encounter closed

## 2020-03-08 DIAGNOSIS — J301 Allergic rhinitis due to pollen: Secondary | ICD-10-CM | POA: Diagnosis not present

## 2020-03-08 DIAGNOSIS — J3089 Other allergic rhinitis: Secondary | ICD-10-CM | POA: Diagnosis not present

## 2020-03-08 DIAGNOSIS — J3081 Allergic rhinitis due to animal (cat) (dog) hair and dander: Secondary | ICD-10-CM | POA: Diagnosis not present

## 2020-03-08 NOTE — Telephone Encounter (Signed)
This sound good kd

## 2020-03-09 NOTE — Progress Notes (Signed)
GYNECOLOGY  VISIT  CC:   Estring removal  HPI: 59 y.o. [id number] Married Declined female here for estring removal.     GYNECOLOGIC HISTORY: Patient's last menstrual period was 09/28/2008 (exact date). Contraception:post menopausal Menopausal hormone therapy: none  Patient Active Problem List   Diagnosis Date Noted  . Age-related osteoporosis without current pathological fracture 02/01/2020  . Closed fracture of distal end of radius 03/25/2018  . Other pancytopenia (HCC) 11/09/2014  . 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 Anemia   . Anxiety    with air travel   . Dysmenorrhea   . H/O renal calculi   . Mastalgia   . Ovarian cyst     Past Surgical History:  Procedure Laterality Date  . COLONOSCOPY  10/25/10   negative recheckin 10 years  . SKIN BIOPSY    . WISDOM TOOTH EXTRACTION      MEDS:   Current Outpatient Medications on File Prior to Visit  Medication Sig Dispense Refill  . cetirizine (ZYRTEC) 10 MG tablet Take 10 mg by mouth as needed.     Marland Kitchen DEXILANT 60 MG capsule Take 1 capsule by mouth daily.     Marland Kitchen estradiol (ESTRING) 2 MG vaginal ring Place 2 mg vaginally every 3 (three) months. Insert a new ring into vagina every 3 months 1 each 4  . Probiotic Product (PROBIOTIC PO) Take by mouth.    Marland Kitchen UNABLE TO FIND Allergy Injections once a month     No current facility-administered medications on file prior to visit.    ALLERGIES: Corn-containing products and Sulfa antibiotics  Family History  Problem Relation Age of Onset  . Hypertension Mother   . Thyroid disease Mother   . Hyperlipidemia Mother   . Heart failure Maternal Grandmother   . Stroke Paternal Grandmother   . Heart failure Paternal Uncle     Review of Systems  Constitutional: Negative.   HENT: Negative.   Eyes: Negative.   Respiratory: Negative.   Cardiovascular: Negative.   Gastrointestinal: Negative.   Endocrine: Negative.   Genitourinary:  Negative.   Musculoskeletal: Negative.   Skin: Negative.   Allergic/Immunologic: Negative.   Neurological: Negative.   Hematological: Negative.   Psychiatric/Behavioral: Negative.     PHYSICAL EXAMINATION:    BP 100/64   Pulse 68   Resp 16   Wt 116 lb (52.6 kg)   LMP 09/28/2008 (Exact Date) Comment: has E-String  BMI 21.22 kg/m     General appearance: alert, cooperative and appears stated age   Pelvic: External genitalia:  no lesions Vagina palpates normal Estring identified              Bimanual Exam:  Uterus:  not examined         Chaperone, Joy, CMA, was present for exam.  Assessment: Vaginal atrophy, well managed with Estring Medication Management-removal of estring today  Plan: Estring removed with out difficulty Pt will insert her own estring  RTC 3 months for removal

## 2020-03-12 ENCOUNTER — Other Ambulatory Visit: Payer: Self-pay

## 2020-03-12 ENCOUNTER — Ambulatory Visit (INDEPENDENT_AMBULATORY_CARE_PROVIDER_SITE_OTHER): Payer: BC Managed Care – PPO | Admitting: Nurse Practitioner

## 2020-03-12 ENCOUNTER — Encounter: Payer: Self-pay | Admitting: Nurse Practitioner

## 2020-03-12 VITALS — BP 100/64 | HR 68 | Resp 16 | Wt 116.0 lb

## 2020-03-12 DIAGNOSIS — J301 Allergic rhinitis due to pollen: Secondary | ICD-10-CM | POA: Diagnosis not present

## 2020-03-12 DIAGNOSIS — J3089 Other allergic rhinitis: Secondary | ICD-10-CM | POA: Diagnosis not present

## 2020-03-12 DIAGNOSIS — N952 Postmenopausal atrophic vaginitis: Secondary | ICD-10-CM

## 2020-03-12 NOTE — Patient Instructions (Signed)
Nice to meet you today!

## 2020-03-14 DIAGNOSIS — J301 Allergic rhinitis due to pollen: Secondary | ICD-10-CM | POA: Diagnosis not present

## 2020-03-21 DIAGNOSIS — J3089 Other allergic rhinitis: Secondary | ICD-10-CM | POA: Diagnosis not present

## 2020-03-21 DIAGNOSIS — J301 Allergic rhinitis due to pollen: Secondary | ICD-10-CM | POA: Diagnosis not present

## 2020-03-22 DIAGNOSIS — F4323 Adjustment disorder with mixed anxiety and depressed mood: Secondary | ICD-10-CM | POA: Diagnosis not present

## 2020-03-26 DIAGNOSIS — J301 Allergic rhinitis due to pollen: Secondary | ICD-10-CM | POA: Diagnosis not present

## 2020-03-26 DIAGNOSIS — J3089 Other allergic rhinitis: Secondary | ICD-10-CM | POA: Diagnosis not present

## 2020-04-06 DIAGNOSIS — J301 Allergic rhinitis due to pollen: Secondary | ICD-10-CM | POA: Diagnosis not present

## 2020-04-09 DIAGNOSIS — J301 Allergic rhinitis due to pollen: Secondary | ICD-10-CM | POA: Diagnosis not present

## 2020-04-10 ENCOUNTER — Other Ambulatory Visit: Payer: BC Managed Care – PPO

## 2020-04-17 DIAGNOSIS — J301 Allergic rhinitis due to pollen: Secondary | ICD-10-CM | POA: Diagnosis not present

## 2020-04-18 DIAGNOSIS — F4323 Adjustment disorder with mixed anxiety and depressed mood: Secondary | ICD-10-CM | POA: Diagnosis not present

## 2020-04-24 DIAGNOSIS — J301 Allergic rhinitis due to pollen: Secondary | ICD-10-CM | POA: Diagnosis not present

## 2020-05-11 ENCOUNTER — Other Ambulatory Visit (INDEPENDENT_AMBULATORY_CARE_PROVIDER_SITE_OTHER): Payer: 59

## 2020-05-11 ENCOUNTER — Other Ambulatory Visit: Payer: Self-pay

## 2020-05-11 DIAGNOSIS — M810 Age-related osteoporosis without current pathological fracture: Secondary | ICD-10-CM

## 2020-05-12 LAB — VITAMIN D 25 HYDROXY (VIT D DEFICIENCY, FRACTURES): Vit D, 25-Hydroxy: 55.7 ng/mL (ref 30.0–100.0)

## 2020-05-30 ENCOUNTER — Other Ambulatory Visit: Payer: BC Managed Care – PPO

## 2020-06-14 ENCOUNTER — Ambulatory Visit: Payer: 59 | Admitting: Obstetrics and Gynecology

## 2020-06-14 ENCOUNTER — Encounter: Payer: Self-pay | Admitting: Obstetrics and Gynecology

## 2020-06-14 ENCOUNTER — Other Ambulatory Visit: Payer: Self-pay

## 2020-06-14 VITALS — BP 100/64 | HR 66 | Ht 63.5 in | Wt 120.2 lb

## 2020-06-14 DIAGNOSIS — N952 Postmenopausal atrophic vaginitis: Secondary | ICD-10-CM

## 2020-06-14 NOTE — Progress Notes (Signed)
GYNECOLOGY  VISIT   HPI: 60 y.o.   Married Declined Not Hispanic or Latino  female   [phone] with Patient's last menstrual period was 09/28/2008 (exact date).   here for removal or Estring.  She can't remove the estring on her own, no trouble putting it in. Not currently sexually active, husband has parkinson's and the associated dementia.  They are going to the [place] this month.     GYNECOLOGIC HISTORY: Patient's last menstrual period was 09/28/2008 (exact date). Contraception:PMP  Menopausal hormone therapy:  E-string        OB History    Gravida  2   Para  2   Term  2   Preterm  0   AB  0   Living  2     SAB  0   IAB  0   Ectopic  0   Multiple  0   Live Births  2              Patient Active Problem List   Diagnosis Date Noted  . Age-related osteoporosis without current pathological fracture 02/01/2020  . Closed fracture of distal end of radius 03/25/2018  . Other pancytopenia (HCC) 11/09/2014  . 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 Anemia   . Anxiety    with air travel   . Dysmenorrhea   . H/O renal calculi   . Mastalgia   . Ovarian cyst     Past Surgical History:  Procedure Laterality Date  . COLONOSCOPY  10/25/10   negative recheckin 10 years  . SKIN BIOPSY    . WISDOM TOOTH EXTRACTION      Current Outpatient Medications  Medication Sig Dispense Refill  . cetirizine (ZYRTEC) 10 MG tablet Take 10 mg by mouth as needed.     Marland Kitchen DEXILANT 60 MG capsule Take 1 capsule by mouth daily.     Marland Kitchen estradiol (ESTRING) 2 MG vaginal ring Place 2 mg vaginally every 3 (three) months. Insert a new ring into vagina every 3 months 1 each 4  . Probiotic Product (PROBIOTIC PO) Take by mouth.    Marland Kitchen UNABLE TO FIND Allergy Injections once a month     No current facility-administered medications for this visit.     ALLERGIES: Corn-containing products and Sulfa antibiotics  Family History  Problem  Relation Age of Onset  . Hypertension Mother   . Thyroid disease Mother   . Hyperlipidemia Mother   . Heart failure Maternal Grandmother   . Stroke Paternal Grandmother   . Heart failure Paternal Uncle     Social History   Socioeconomic History  . Marital status: Married    Spouse name: Not on file  . Number of children: 2  . Years of education: Not on file  . Highest education level: Not on file  Occupational History  . Occupation: Retired  Tobacco Use  . Smoking status: Never Smoker  . Smokeless tobacco: Never Used  Substance and Sexual Activity  . Alcohol use: Yes    Alcohol/week: 0.0 - 1.0 standard drinks    Comment: rarely  . Drug use: No  . Sexual activity: Yes    Partners: Male    Birth control/protection: Post-menopausal  Other Topics Concern  . Not on file  Social History Narrative  . Not on file   Social Determinants of Health   Financial Resource Strain: Not on file  Food Insecurity: Not on file  Transportation  Needs: Not on file  Physical Activity: Not on file  Stress: Not on file  Social Connections: Not on file  Intimate Partner Violence: Not on file    Review of Systems  All other systems reviewed and are negative.   PHYSICAL EXAMINATION:    LMP 09/28/2008 (Exact Date) Comment: has E-String    General appearance: alert, cooperative and appears stated age   Pelvic: External genitalia:  no lesions              Urethra:  normal appearing urethra with no masses, tenderness or lesions              Bartholins and Skenes: normal                 Vagina: estring removed   1. Vaginal atrophy Discussed if she should continue with the estring. She is not currently sexually active.  We discussed the option of stopping it. Given the expense we also discussed the option of the estradiol vaginal tablets.

## 2020-09-07 ENCOUNTER — Other Ambulatory Visit: Payer: Self-pay

## 2020-09-26 ENCOUNTER — Encounter: Payer: Self-pay | Admitting: Obstetrics and Gynecology

## 2020-09-26 ENCOUNTER — Ambulatory Visit (INDEPENDENT_AMBULATORY_CARE_PROVIDER_SITE_OTHER): Payer: 59 | Admitting: Obstetrics and Gynecology

## 2020-09-26 ENCOUNTER — Other Ambulatory Visit: Payer: Self-pay

## 2020-09-26 VITALS — BP 90/62 | HR 73 | Ht 63.0 in | Wt 120.8 lb

## 2020-09-26 DIAGNOSIS — N952 Postmenopausal atrophic vaginitis: Secondary | ICD-10-CM | POA: Diagnosis not present

## 2020-09-26 MED ORDER — ESTRADIOL 10 MCG VA TABS: 1.0000 | ORAL_TABLET | VAGINAL | 1 refills | Status: DC

## 2020-09-26 NOTE — Progress Notes (Signed)
GYNECOLOGY  VISIT   HPI: 60 y.o.   Married Declined Not Hispanic or Latino  female   [phone] with Patient's last menstrual period was 09/28/2008 (exact date).   here for E-string removal. Would like to change to the vaginal estrogen tablets.  Her husband Parkinson;s and dementia are getting worse, she is struggling.   GYNECOLOGIC HISTORY: Patient's last menstrual period was 09/28/2008 (exact date). Contraception: PMP Menopausal hormone therapy: E-string.         OB History    Gravida  2   Para  2   Term  2   Preterm  0   AB  0   Living  2     SAB  0   IAB  0   Ectopic  0   Multiple  0   Live Births  2              Patient Active Problem List   Diagnosis Date Noted  . Age-related osteoporosis without current pathological fracture 02/01/2020  . Closed fracture of distal end of radius 03/25/2018  . Other pancytopenia (HCC) 11/09/2014  . 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 Anemia   . Anxiety    with air travel   . Dysmenorrhea   . H/O renal calculi   . Mastalgia   . Ovarian cyst     Past Surgical History:  Procedure Laterality Date  . COLONOSCOPY  10/25/10   negative recheckin 10 years  . SKIN BIOPSY    . WISDOM TOOTH EXTRACTION      Current Outpatient Medications  Medication Sig Dispense Refill  . cetirizine (ZYRTEC) 10 MG tablet Take 10 mg by mouth as needed.     Marland Kitchen DEXILANT 60 MG capsule Take 1 capsule by mouth daily.     Marland Kitchen estradiol (ESTRING) 2 MG vaginal ring Place 2 mg vaginally every 3 (three) months. Insert a new ring into vagina every 3 months 1 each 4  . Probiotic Product (PROBIOTIC PO) Take by mouth.    Marland Kitchen UNABLE TO FIND Allergy Injections once a month     No current facility-administered medications for this visit.     ALLERGIES: Corn-containing products and Sulfa antibiotics  Family History  Problem Relation Age of Onset  . Hypertension Mother   . Thyroid disease Mother   .  Hyperlipidemia Mother   . Heart failure Maternal Grandmother   . Stroke Paternal Grandmother   . Heart failure Paternal Uncle     Social History   Socioeconomic History  . Marital status: Married    Spouse name: Not on file  . Number of children: 2  . Years of education: Not on file  . Highest education level: Not on file  Occupational History  . Occupation: Retired  Tobacco Use  . Smoking status: Never Smoker  . Smokeless tobacco: Never Used  Substance and Sexual Activity  . Alcohol use: Yes    Alcohol/week: 0.0 - 1.0 standard drinks    Comment: rarely  . Drug use: No  . Sexual activity: Yes    Partners: Male    Birth control/protection: Post-menopausal  Other Topics Concern  . Not on file  Social History Narrative  . Not on file   Social Determinants of Health   Financial Resource Strain: Not on file  Food Insecurity: Not on file  Transportation Needs: Not on file  Physical Activity: Not on file  Stress: Not on file  Social  Connections: Not on file  Intimate Partner Violence: Not on file    Review of Systems  All other systems reviewed and are negative.   PHYSICAL EXAMINATION:    BP 90/62   Pulse 73   Ht 5\' 3"  (1.6 m)   Wt 120 lb 12.8 oz (54.8 kg)   LMP 09/28/2008 (Exact Date) Comment: has E-String  SpO2 99%   BMI 21.40 kg/m     General appearance: alert, cooperative and appears stated age  Pelvic: External genitalia:  no lesions              Urethra:  normal appearing urethra with no masses, tenderness or lesions              Bartholins and Skenes: normal                 Vagina: estring removed  1. Vaginal atrophy She can't remove her estring. Would like to change to the vaginal tablets - Estradiol 10 MCG TABS vaginal tablet; Place 1 tablet (10 mcg total) vaginally 2 (two) times a week.  Dispense: 24 tablet; Refill: 1 -F/U for annual exam in 8/22

## 2020-09-28 ENCOUNTER — Telehealth: Payer: Self-pay | Admitting: *Deleted

## 2020-09-28 NOTE — Telephone Encounter (Signed)
PA done via phone with [employer] [phone]. Reference # [id number], will wait to hear from insurance 24-72 hours turn around. Patient aware as well.

## 2020-10-09 NOTE — Telephone Encounter (Signed)
PA denied by [facility] patient will need to try and fail or have intolerance to at least 3 of listed formulary drugs : estradiol vaginal cream 0.0%, estring, estradiol week or bi weekly patches, estradiol tablet, estradiol valerate injection.  Please advise

## 2020-10-09 NOTE — Telephone Encounter (Signed)
Your list includes estradiol tablets, but they declined the vaginal estradiol tablets? Please check if she is okay with the cream. If so, then call in estrace vaginal cream. 1 gram vaginally 2 x a week at hs. #42.5 grams, one refill.

## 2020-10-10 NOTE — Telephone Encounter (Signed)
Correct the vaginal estradiol tablets are NOT covered the "estradiol tablets" would be oral estrogen tablet.   Patient informed with below note, patient said she found a coupon card to help reduce the cost. Patient did wish to start estradiol vaginal cream.

## 2020-10-21 IMAGING — DX DG WRIST COMPLETE 3+V*R*
4 series · 4 of 4 positions shown · non-contrast
Comparison: None.

CLINICAL DATA: Fall in shower, wrist deformity.

EXAM:
RIGHT WRIST - COMPLETE 3+ VIEW

[wrist pa]
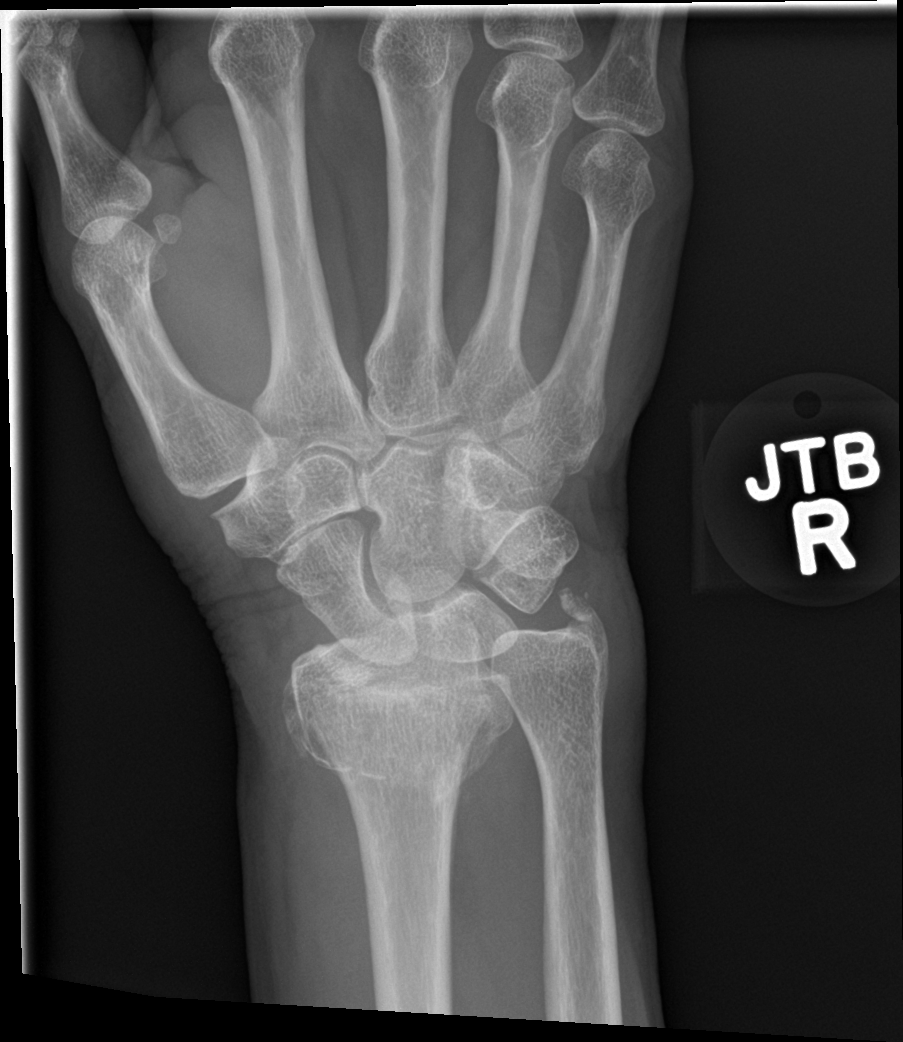

[wrist obl]
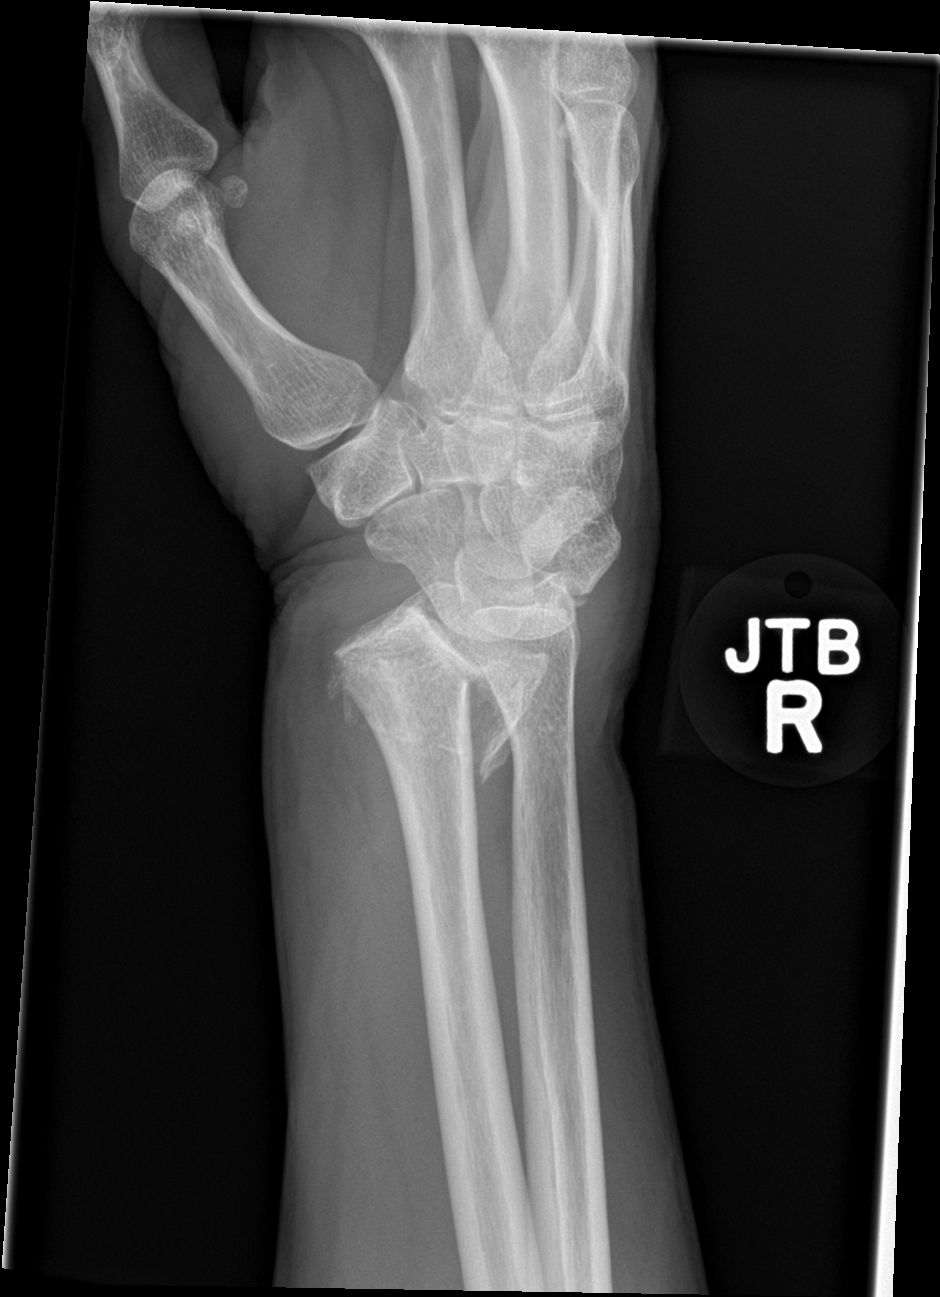

[wrist lat]
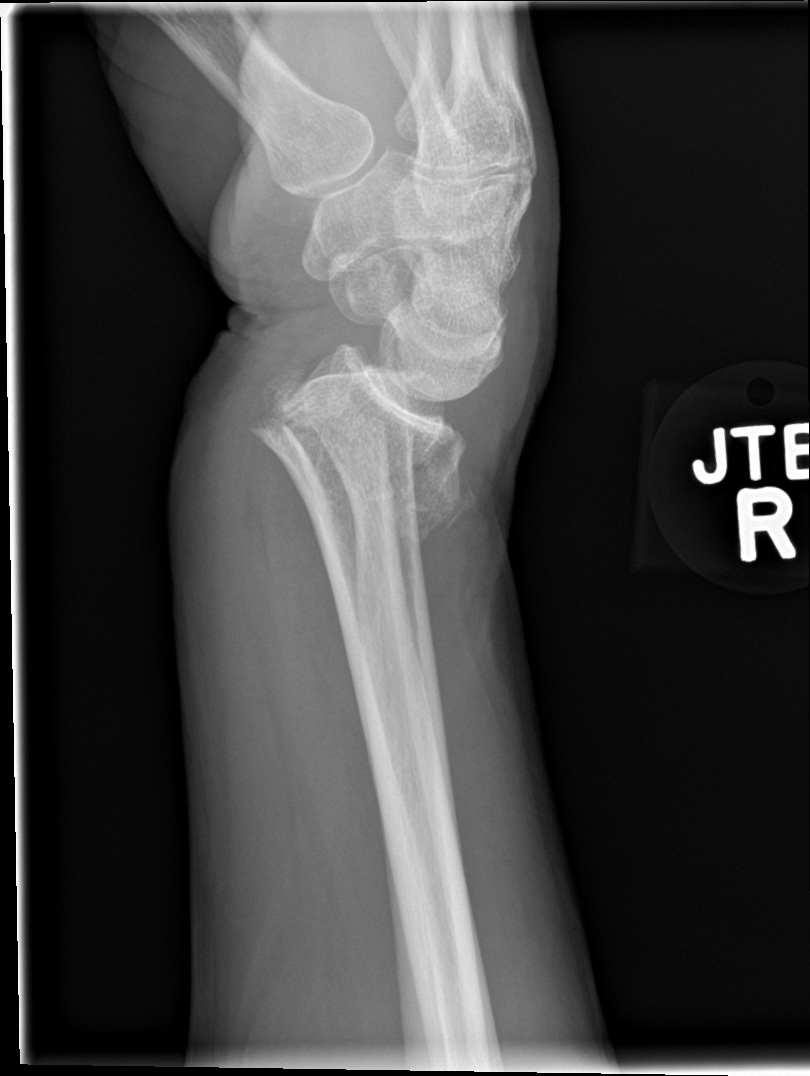

[wrist navicular]
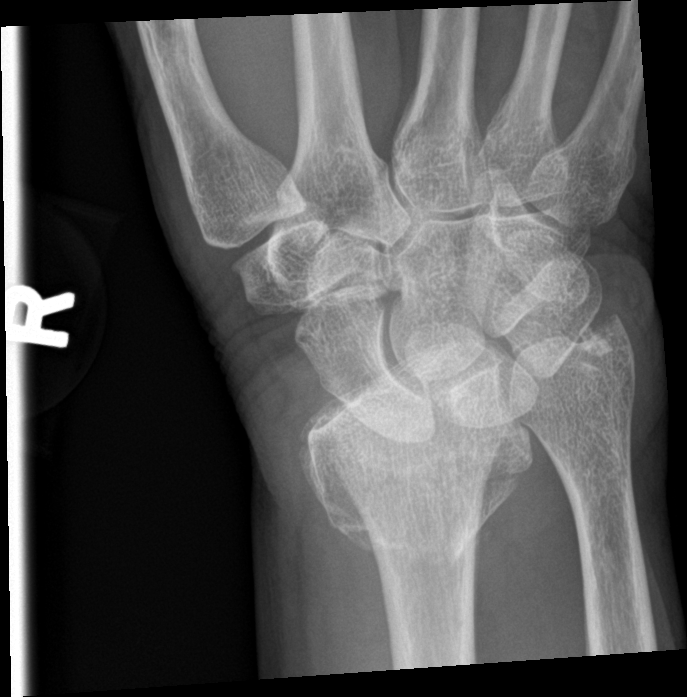

[4 of 4 positions shown; findings below may reference images not displayed]

FINDINGS: Distal radial transverse metaphyseal fracture with 46 degrees of
apex anterior angulation. Probable extension into the distal radial
articular surface. Mild comminution.

Ossicle along the ulnar styloid favoring age-indeterminate fracture.
IMPRESSION: 1. Distal radial metaphyseal fracture with 46 degrees apex anterior
angulation and probable extension to the distal radial articular
surface along with mild comminution.
2. Suspected fracture of the ulnar styloid.

## 2020-12-11 NOTE — Progress Notes (Signed)
60 y.o. [id number] Married Declined Not Hispanic or Latino female here for annual exam.  No vaginal bleeding, no bowel or bladder issues.  She is using vaginal estrogen tablets.   Husband has parkinson's and dementia. Dementia is deteriorating, needs full time care, still at home.    55 year old mom is local and lives alone.   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 She exercises a lot, it is her stress relief.   Seeing Dr Elvera Lennox for her osteoporosis    Patient's last menstrual period was 09/28/2008 (exact date).          Sexually active: Yes.    The current method of family planning is post menopausal status.    Exercising: Yes.     Cardio and weights  Smoker:  no  Health Maintenance: Pap:  12/13/19 neg, neg HPV; 11-18-17 neg HPV HR neg, 10/04/14 WNL HPV Neg  History of abnormal Pap:  no MMG:  12/06/19 Bi-rads 1 neg  BMD:   12/21/18 osteoporotic, T score of spine -2.6. Scheduled DEXA later this month with Dr Elvera Lennox Colonoscopy: 2012 f/u 74yrs, endoscopy  TDaP:  2016 Gardasil: NA   reports that she has never smoked. She has never used smokeless tobacco. She reports current alcohol use. She reports that she does not use drugs. Rare ETOH. 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   Past Medical History:  Diagnosis Date   Anemia    Anxiety    with air travel    Dysmenorrhea    H/O renal calculi    Mastalgia    Ovarian cyst     Past Surgical History:  Procedure Laterality Date   COLONOSCOPY  10/25/10   negative recheckin 10 years   SKIN BIOPSY     WISDOM TOOTH EXTRACTION      Current Outpatient Medications  Medication Sig Dispense Refill   cetirizine (ZYRTEC) 10 MG tablet Take 10 mg by mouth as needed.      DEXILANT 60 MG capsule Take 1 capsule by mouth daily.      Estradiol 10 MCG TABS vaginal tablet Place 1 tablet (10 mcg total) vaginally 2 (two) times a week. 24 tablet 1   Probiotic Product (PROBIOTIC PO) Take by mouth.     UNABLE TO FIND Allergy  Injections once a month     No current facility-administered medications for this visit.    Family History  Problem Relation Age of Onset   Hypertension Mother    Thyroid disease Mother    Hyperlipidemia Mother    Heart failure Maternal Grandmother    Stroke Paternal Grandmother    Heart failure Paternal Uncle     Review of Systems  Psychiatric/Behavioral:  The patient is nervous/anxious.   All other systems reviewed and are negative.  Exam:   BP 90/62   Pulse 66   Ht 5' 3.5" (1.613 m)   Wt 122 lb (55.3 kg)   LMP 09/28/2008 (Exact Date) Comment: has E-String  SpO2 100%   BMI 21.27 kg/m   Weight change: @WEIGHTCHANGE @ Height:   Height: 5' 3.5" (161.3 cm)  Ht Readings from Last 3 Encounters:  12/12/20 5' 3.5" (1.613 m)  09/26/20 5\' 3"  (1.6 m)  06/14/20 5' 3.5" (1.613 m)    General appearance: alert, cooperative and appears stated age Head: Normocephalic, without obvious abnormality, atraumatic Neck: no adenopathy, supple, symmetrical, trachea midline and thyroid normal to inspection and palpation Lungs: clear to auscultation bilaterally Cardiovascular: regular rate and rhythm Breasts:  normal appearance, no masses or tenderness Abdomen: soft, non-tender; non distended,  no masses,  no organomegaly Extremities: extremities normal, atraumatic, no cyanosis or edema Skin: Skin color, texture, turgor normal. No rashes or lesions Lymph nodes: Cervical, supraclavicular, and axillary nodes normal. No abnormal inguinal nodes palpated Neurologic: Grossly normal   Pelvic: External genitalia:  no lesions              Urethra:  normal appearing urethra with no masses, tenderness or lesions              Bartholins and Skenes: normal                 Vagina: normal appearing vagina with normal color and discharge, no lesions              Cervix: no lesions               Bimanual Exam:  Uterus:  normal size, contour, position, consistency, mobility, non-tender              Adnexa:  no mass, fullness, tenderness               Rectovaginal: Confirms               Anus:  normal sphincter tone, no lesions  Carolynn Serve chaperoned for the exam.  1. Well woman exam Discussed breast self exam Discussed calcium and vit D intake Mammogram and colonoscopy due, she knows to schedule  2. Laboratory exam ordered as part of routine general medical examination - CBC - Comprehensive metabolic panel - Lipid panel  3. Vaginal atrophy Doing well with vaginal estrogen - Estradiol 10 MCG TABS vaginal tablet; Place 1 tablet (10 mcg total) vaginally 2 (two) times a week.  Dispense: 24 tablet; Refill: 3  4. Localized osteoporosis of spine DEXA scheduled She will F/U with Endocrinology

## 2020-12-12 ENCOUNTER — Encounter: Payer: Self-pay | Admitting: Obstetrics and Gynecology

## 2020-12-12 ENCOUNTER — Other Ambulatory Visit: Payer: Self-pay

## 2020-12-12 ENCOUNTER — Ambulatory Visit (INDEPENDENT_AMBULATORY_CARE_PROVIDER_SITE_OTHER): Payer: 59 | Admitting: Obstetrics and Gynecology

## 2020-12-12 VITALS — BP 90/62 | HR 66 | Ht 63.5 in | Wt 122.0 lb

## 2020-12-12 DIAGNOSIS — Z91018 Allergy to other foods: Secondary | ICD-10-CM | POA: Insufficient documentation

## 2020-12-12 DIAGNOSIS — Z01419 Encounter for gynecological examination (general) (routine) without abnormal findings: Secondary | ICD-10-CM

## 2020-12-12 DIAGNOSIS — M816 Localized osteoporosis [Lequesne]: Secondary | ICD-10-CM

## 2020-12-12 DIAGNOSIS — Z0000 Encounter for general adult medical examination without abnormal findings: Secondary | ICD-10-CM

## 2020-12-12 DIAGNOSIS — K200 Eosinophilic esophagitis: Secondary | ICD-10-CM | POA: Insufficient documentation

## 2020-12-12 DIAGNOSIS — N952 Postmenopausal atrophic vaginitis: Secondary | ICD-10-CM | POA: Diagnosis not present

## 2020-12-12 DIAGNOSIS — K219 Gastro-esophageal reflux disease without esophagitis: Secondary | ICD-10-CM | POA: Insufficient documentation

## 2020-12-12 DIAGNOSIS — J3081 Allergic rhinitis due to animal (cat) (dog) hair and dander: Secondary | ICD-10-CM | POA: Insufficient documentation

## 2020-12-12 DIAGNOSIS — J309 Allergic rhinitis, unspecified: Secondary | ICD-10-CM | POA: Insufficient documentation

## 2020-12-12 MED ORDER — ESTRADIOL 10 MCG VA TABS: 1.0000 | ORAL_TABLET | VAGINAL | 3 refills | Status: DC

## 2020-12-12 NOTE — Patient Instructions (Signed)

## 2020-12-13 LAB — COMPREHENSIVE METABOLIC PANEL
AG Ratio: 1.9 (calc) (ref 1.0–2.5)
ALT: 14 U/L (ref 6–29)
AST: 23 U/L (ref 10–35)
Albumin: 4.4 g/dL (ref 3.6–5.1)
Alkaline phosphatase (APISO): 75 U/L (ref 37–153)
BUN: 12 mg/dL (ref 7–25)
CO2: 28 mmol/L (ref 20–32)
Calcium: 9.3 mg/dL (ref 8.6–10.4)
Chloride: 104 mmol/L (ref 98–110)
Creat: 0.86 mg/dL (ref 0.50–1.05)
Globulin: 2.3 g/dL (calc) (ref 1.9–3.7)
Glucose, Bld: 82 mg/dL (ref 65–99)
Potassium: 4.2 mmol/L (ref 3.5–5.3)
Sodium: 139 mmol/L (ref 135–146)
Total Bilirubin: 0.6 mg/dL (ref 0.2–1.2)
Total Protein: 6.7 g/dL (ref 6.1–8.1)

## 2020-12-13 LAB — LIPID PANEL
Cholesterol: 213 mg/dL — ABNORMAL HIGH (ref <200)
HDL: 72 mg/dL (ref >=50)
LDL Cholesterol (Calc): 119 mg/dL (calc) — ABNORMAL HIGH
Non-HDL Cholesterol (Calc): 141 mg/dL (calc) — ABNORMAL HIGH (ref <130)
Total CHOL/HDL Ratio: 3 (calc) (ref <5.0)
Triglycerides: 110 mg/dL (ref <150)

## 2020-12-13 LAB — CBC
HCT: 38.6 % (ref 35.0–45.0)
Hemoglobin: 12.4 g/dL (ref 11.7–15.5)
MCH: 30.3 pg (ref 27.0–33.0)
MCHC: 32.1 g/dL (ref 32.0–36.0)
MCV: 94.4 fL (ref 80.0–100.0)
MPV: 11.7 fL (ref 7.5–12.5)
Platelets: 181 10*3/uL (ref 140–400)
RBC: 4.09 10*6/uL (ref 3.80–5.10)
RDW: 13.2 % (ref 11.0–15.0)
WBC: 4.4 10*3/uL (ref 3.8–10.8)

## 2020-12-24 ENCOUNTER — Ambulatory Visit
Admission: RE | Admit: 2020-12-24 | Discharge: 2020-12-24 | Disposition: A | Discharge status: Home / Self Care | Payer: 59 | Source: Ambulatory Visit | Attending: Internal Medicine | Admitting: Internal Medicine

## 2020-12-24 ENCOUNTER — Other Ambulatory Visit: Payer: Self-pay

## 2020-12-24 DIAGNOSIS — M810 Age-related osteoporosis without current pathological fracture: Secondary | ICD-10-CM

## 2021-01-22 ENCOUNTER — Other Ambulatory Visit: Payer: Self-pay | Admitting: Obstetrics and Gynecology

## 2021-01-22 DIAGNOSIS — Z1231 Encounter for screening mammogram for malignant neoplasm of breast: Secondary | ICD-10-CM

## 2021-01-31 ENCOUNTER — Ambulatory Visit: Payer: BC Managed Care – PPO | Admitting: Internal Medicine

## 2021-02-08 ENCOUNTER — Other Ambulatory Visit: Payer: Self-pay

## 2021-02-08 ENCOUNTER — Ambulatory Visit
Admission: RE | Admit: 2021-02-08 | Discharge: 2021-02-08 | Disposition: A | Discharge status: Home / Self Care | Payer: 59 | Source: Ambulatory Visit

## 2021-02-08 DIAGNOSIS — Z1231 Encounter for screening mammogram for malignant neoplasm of breast: Secondary | ICD-10-CM

## 2021-02-25 ENCOUNTER — Encounter: Payer: Self-pay | Admitting: Internal Medicine

## 2021-02-25 ENCOUNTER — Ambulatory Visit (INDEPENDENT_AMBULATORY_CARE_PROVIDER_SITE_OTHER): Payer: 59 | Admitting: Internal Medicine

## 2021-02-25 ENCOUNTER — Other Ambulatory Visit: Payer: Self-pay

## 2021-02-25 VITALS — BP 120/78 | HR 68 | Ht 63.5 in | Wt 120.4 lb

## 2021-02-25 DIAGNOSIS — M810 Age-related osteoporosis without current pathological fracture: Secondary | ICD-10-CM

## 2021-02-25 NOTE — Patient Instructions (Signed)
Please look int [facility].  Let me know about the last vitamin D level.  Please come back for a follow-up appointment in 1 year.

## 2021-02-25 NOTE — Progress Notes (Signed)
Patient ID: Wendy Medina, female   DOB: April 09, 1961, 60 y.o.   MRN: [id number]   This visit occurred during the SARS-CoV-2 public health emergency.  Safety protocols were in place, including screening questions prior to the visit, additional usage of staff PPE, and extensive cleaning of exam room while observing appropriate contact time as indicated for disinfecting solutions.   HPI  Wendy Medina is a 60 y.o.-year-old female, initially referred by her OB/GYN doctor, Dr. Oscar La, returning for follow-up for osteoporosis (OP).  Last visit 1 year ago.  Interim history: No falls or fractures since last visit. No dizziness/vertigo/poor vision. Bought a vibration platform - uses this 2x a week.  She increased weight bearing exercises. She is also riding her bike. She had shingles recently. He continues to see integrative Medicine - last visit 2 weeks ago.  Reviewed history: Pt was dx with OP in 12/2018.  I reviewed pt's DXA scans: Date L1-L4 T score FN T score 33% distal Radius FRAX  12/24/2020 (bre[facility] center L1-L2: -2.4 RFN: -1.9 LFN: -1.3 N/a 10 year MOF: 28.1% 10-year hip fracture risk: 2.3%  12/21/2018 (breast center) L1-L2: -2.6 RFN: -1.8 LFN: -1.4 n/a    She has a history of right distal radial fracture 03/2018 - slipped and fell in the bathroom.  On glutathione and vitamins infusion.  Also takes magnesium.  No Previous OP treatments.  No h/o vitamin D deficiency. Reviewed available vit D levels: Lab Results  Component Value Date   VD25OH 55.7 05/11/2020   VD25OH 77.2 02/01/2020   VD25OH 32.7 12/07/2019   Pt was on vitamin D liquid 5000-15,000 units daily (recommended daily dose is 1 teaspoon and she is taking between 1 teaspoon and 1 tablespoon)- now 1 capsule (? Dose).  She is on a mostly plant-based diet.  She does have acid reflux and is on Dexilant.  She does Pilates, Yoga, biking, tennis, and running - was training for a half marathon until she sprained her  ankle.  However, after this healed, she ran a 5K and she was on the race for her age group.  She also  started weight bearing exercises during the pandemic.  She has been exercising daily for the last 2 years.  Menopause was at 60 y/o.  She is on vaginal estrogen.  FH of osteoporosis:  Mother with OP - developed in her 20s. Father broke his hip in his 54s and died soon afterwards.  No h/o hyper/hypocalcemia or hyperparathyroidism. No h/o kidney stones. Lab Results  Component Value Date   CALCIUM 9.3 12/12/2020   CALCIUM 9.8 12/07/2019   CALCIUM 9.9 11/09/2014   CALCIUM 10.0 09/14/2012   No h/o thyrotoxicosis. Reviewed TSH recent levels:  Lab Results  Component Value Date   TSH 2.249 09/14/2012   No h/o CKD. Last BUN/Cr: Lab Results  Component Value Date   BUN 12 12/12/2020   CREATININE 0.86 12/12/2020   She has a history of pancytopenia for which she saw hematology. She is on Dexilant for GERD - off and on, 3x a week. Had cough 2/2 GERD and allergies. Now controlled.  Her husband has Parkinson's disease.  ROS: + See HPI + Acid reflux  I reviewed pt's medications, allergies, PMH, social hx, family hx, and changes were documented in the history of present illness. Otherwise, unchanged from my initial visit note.  Past Medical History:  Diagnosis Date   Anemia    Anxiety    with air travel    Dysmenorrhea  H/O renal calculi    Mastalgia    Ovarian cyst    Past Surgical History:  Procedure Laterality Date   COLONOSCOPY  10/25/10   negative recheckin 10 years   SKIN BIOPSY     WISDOM TOOTH EXTRACTION     Social History   Socioeconomic History   Marital status: Married    Spouse name: Not on file   Number of children: 2   Years of education: Not on file   Highest education level: Not on file  Occupational History   Occupation: Retired  Tobacco Use   Smoking status: Never   Smokeless tobacco: Never  Substance and Sexual Activity   Alcohol use: Yes     Alcohol/week: 0.0 - 1.0 standard drinks    Comment: rarely   Drug use: No   Sexual activity: Yes    Partners: Male    Birth control/protection: Post-menopausal  Other Topics Concern   Not on file  Social History Narrative   Not on file   Social Determinants of Health   Financial Resource Strain: Not on file  Food Insecurity: Not on file  Transportation Needs: Not on file  Physical Activity: Not on file  Stress: Not on file  Social Connections: Not on file  Intimate Partner Violence: Not on file   Current Outpatient Medications on File Prior to Visit  Medication Sig Dispense Refill   cetirizine (ZYRTEC) 10 MG tablet Take 10 mg by mouth as needed.      DEXILANT 60 MG capsule Take 1 capsule by mouth daily.      Estradiol 10 MCG TABS vaginal tablet Place 1 tablet (10 mcg total) vaginally 2 (two) times a week. 24 tablet 3   Probiotic Product (PROBIOTIC PO) Take by mouth.     UNABLE TO FIND Allergy Injections once a month     No current facility-administered medications on file prior to visit.   Allergies  Allergen Reactions   Corn-Containing Products Shortness Of Breath   Sulfa Antibiotics    Family History  Problem Relation Age of Onset   Hypertension Mother    Thyroid disease Mother    Hyperlipidemia Mother    Heart failure Maternal Grandmother    Stroke Paternal Grandmother    Heart failure Paternal Uncle     PE: LMP 09/28/2008 (Exact Date) Comment: has E-String Wt Readings from Last 3 Encounters:  12/12/20 122 lb (55.3 kg)  09/26/20 120 lb 12.8 oz (54.8 kg)  06/14/20 120 lb 3.2 oz (54.5 kg)   Constitutional: Normal weight, in NAD. No kyphosis. Eyes: PERRLA, EOMI, no exophthalmos ENT: moist mucous membranes, no thyromegaly, no cervical lymphadenopathy Cardiovascular: RRR, No MRG Respiratory: CTA B Gastrointestinal: abdomen soft, NT, ND, BS+ Musculoskeletal: no deformities, strength intact in all 4 Skin: moist, warm, no rashes Neurological: no tremor with  outstretched hands, DTR normal in all 4  Assessment: 1. Osteoporosis  Plan: 1. Osteoporosis -Likely postmenopausal/age-related; she also has family history of osteoporosis in her parents -We reviewed her bone density scan from 2020 >> an increased risk for fractures as the T-scores were lower than -2.5.  However, she does have small bones in her bone density is  likely to have been read lower than it actually is.  Also, her risk of fracture is actually decreased by her very active lifestyle and her consistent exercise including weightbearing exercises.  Also, only 2 vertebrae were analyzed on the last scan which is suboptimal.  She does have osteoarthritis in her back and calcifications  in L3 and L4 so they had to be excluded from analysis.  However, she had another bone density scan on 12/24/2020 (report and images reviewed) which showed slight improvement at the level of the spine, with a T score being out of the osteoporotic range, at -2.4, only now statistically significant worsening at the level of the femoral necks, of -3.4%. -She is getting a good amount of calcium from the diet.  At last visit she was varying the dose of vitamin D between 5050 1000 units daily as suggested by her integrative provider.  She had just run out before last visit and I advised her to start 4000 units daily. -At last visit she was also on a high dose of vitamin A supplement and I advised her to stop since this was associated with fractures. -She continues to stay away from smoking, alcohol, and follows a plant-based diet -No falls or fractures since last visit -She continues to exercise: Yoga, Pilates and also weightbearing exercises and we discussed about avoiding excessive bending/lifting/turning -At last visit we discussed about possible medications for her osteoporosis.  Since she has GERD, she is not an ideal candidate for p.o. bisphosphonates.  We discussed about possibly using denosumab or zoledronic acid with  the mechanisms of action and expected benefits and possible side effects.  At this visit, we again discussed about the options and she tells me that she would want to avoid medications for now.  She is interested to continue with weightbearing exercises as she also got a vibration platform, which she uses twice a week.  I also suggested the [facility] for skeletal loading.  Given brochure. -will like to continue with vitamin D supplementation, although I am not sure exactly how much she is taking.  She is taking 1 capsule, but unknown dose.  She also feels that she had a vitamin D level checked by her integrative medicine provider.  I advised her to look up the level and let me know and also to let me know about the dose of vitamin D she is taking. -We reviewed together her most recent kidney function and calcium level and these were normal in 12/2020.  Would not repeat this today. - will see pt back in 1 year, with a new DXA scan in 2 years from the previous  - Total time spent for the visit: 25 minutes, in obtaining medical information from the chart and from the patient, reviewing her  previous labs, imaging evaluations, and treatments, reviewing her symptoms, counseling her about her condition (please see the discussed topics above), and developing a plan to further investigate and treat it.  Carlus Pavlov, MD PhD [facility]

## 2021-11-18 ENCOUNTER — Ambulatory Visit: Payer: Commercial Managed Care - HMO | Admitting: Obstetrics and Gynecology

## 2021-11-18 ENCOUNTER — Encounter: Payer: Self-pay | Admitting: Obstetrics and Gynecology

## 2021-11-18 VITALS — BP 102/62 | HR 88 | Temp 99.1°F | Wt 119.0 lb

## 2021-11-18 DIAGNOSIS — N3090 Cystitis, unspecified without hematuria: Secondary | ICD-10-CM

## 2021-11-18 LAB — URINALYSIS, COMPLETE
Bilirubin Urine: NEGATIVE
Casts: NONE SEEN /LPF
Crystals: NONE SEEN /HPF
Glucose, UA: NEGATIVE
Hyaline Cast: NONE SEEN /LPF
Ketones, ur: NEGATIVE
Nitrite: NEGATIVE
Protein, ur: NEGATIVE
RBC / HPF: NONE SEEN /HPF (ref 0–2)
Specific Gravity, Urine: 1.01 (ref 1.001–1.035)
Yeast: NONE SEEN /HPF
pH: 7 (ref 5.0–8.0)

## 2021-11-18 MED ORDER — NITROFURANTOIN MONOHYD MACRO 100 MG PO CAPS: 100.0000 mg | ORAL_CAPSULE | Freq: Two times a day (BID) | ORAL | 0 refills | Status: DC

## 2021-11-18 MED ORDER — PHENAZOPYRIDINE HCL 200 MG PO TABS: 200.0000 mg | ORAL_TABLET | Freq: Three times a day (TID) | ORAL | 0 refills | Status: DC

## 2021-11-18 NOTE — Progress Notes (Signed)
GYNECOLOGY  VISIT   HPI: 61 y.o.   Married Declined Not Hispanic or Latino  female   [phone] with Patient's last menstrual period was 09/28/2008 (exact date).   here for frequency, dysuria and pelvic pressure. She states that she first noticed her symptoms about 2 weeks ago but then they seemed to go away but now they are back.  No fevers.   Under lots of stress, husband with parkinson's and dementia, needs full time care.   GYNECOLOGIC HISTORY: Patient's last menstrual period was 09/28/2008 (exact date). Contraception:pmp  Menopausal hormone therapy: yes estradiol.         OB History     Gravida  2   Para  2   Term  2   Preterm  0   AB  0   Living  2      SAB  0   IAB  0   Ectopic  0   Multiple  0   Live Births  2              Patient Active Problem List   Diagnosis Date Noted   Allergic rhinitis 12/12/2020   Allergic rhinitis due to animal (cat) (dog) hair and dander 12/12/2020   Eosinophilic esophagitis 12/12/2020   Food allergy 12/12/2020   Gastro-esophageal reflux disease without esophagitis 12/12/2020   Age-related osteoporosis without current pathological fracture 02/01/2020   Closed fracture of distal end of radius 03/25/2018   Other pancytopenia (HCC) 11/09/2014   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      Estradiol 10 MCG TABS vaginal tablet Place 1 tablet (10 mcg total) vaginally 2 (two) times a week. 24 tablet 3   Probiotic Product (PROBIOTIC PO) Take by mouth.     UNABLE TO FIND Allergy Injections once a  month     No current facility-administered medications for this visit.     ALLERGIES: Corn-containing products and Sulfa antibiotics  Family History  Problem Relation Age of Onset   Hypertension Mother    Thyroid disease Mother    Hyperlipidemia Mother    Heart failure Maternal Grandmother    Stroke Paternal Grandmother    Heart failure Paternal Uncle     Social History   Socioeconomic History   Marital status: Married    Spouse name: Not on file   Number of children: 2   Years of education: Not on file   Highest education level: Not on file  Occupational History   Occupation: Retired  Tobacco Use   Smoking status: Never   Smokeless tobacco: Never  Substance and Sexual Activity   Alcohol use: Yes    Alcohol/week: 0.0 - 1.0 standard drinks of alcohol    Comment: rarely   Drug use: No   Sexual activity: Yes    Partners: Male    Birth control/protection: Post-menopausal  Other Topics Concern   Not on file  Social History Narrative   Not  on file   Social Determinants of Health   Financial Resource Strain: Not on file  Food Insecurity: Not on file  Transportation Needs: Not on file  Physical Activity: Not on file  Stress: Not on file  Social Connections: Not on file  Intimate Partner Violence: Not on file    Review of Systems  Genitourinary:  Positive for dysuria, flank pain, frequency and urgency.  All other systems reviewed and are negative.   PHYSICAL EXAMINATION:    BP 102/62   Pulse 88   Temp 99.1 F (37.3 C)   Wt 119 lb (54 kg)   LMP 09/28/2008 (Exact Date) Comment: has E-String  SpO2 99%   BMI 20.75 kg/m     General appearance: alert, cooperative and appears stated age CVA: not tender Abdomen: soft, mildly tender in the SP region and LLQ; non distended, no masses,  no organomegaly  1. Cystitis - Urinalysis, Complete - Urine Culture - nitrofurantoin, macrocrystal-monohydrate, (MACROBID) 100 MG capsule; Take 1 capsule (100 mg total) by  mouth 2 (two) times daily.  Dispense: 10 capsule; Refill: 0 - phenazopyridine (PYRIDIUM) 200 MG tablet; Take 1 tablet (200 mg total) by mouth 3 (three) times daily with meals.  Dispense: 6 tablet; Refill: 0 She has listed an allergy to corn, states she has done fine with medications/vaccines with corn related products.

## 2021-11-18 NOTE — Patient Instructions (Signed)
Urinary Tract Infection, Adult  A urinary tract infection (UTI) is an infection of any part of the urinary tract. The urinary tract includes the kidneys, ureters, bladder, and urethra. These organs make, store, and get rid of urine in the body. An upper UTI affects the ureters and kidneys. A lower UTI affects the bladder and urethra. What are the causes? Most urinary tract infections are caused by bacteria in your genital area around your urethra, where urine leaves your body. These bacteria grow and cause inflammation of your urinary tract. What increases the risk? You are more likely to develop this condition if: You have a urinary catheter that stays in place. You are not able to control when you urinate or have a bowel movement (incontinence). You are female and you: Use a spermicide or diaphragm for birth control. Have low estrogen levels. Are pregnant. You have certain genes that increase your risk. You are sexually active. You take antibiotic medicines. You have a condition that causes your flow of urine to slow down, such as: An enlarged prostate, if you are female. Blockage in your urethra. A kidney stone. A nerve condition that affects your bladder control (neurogenic bladder). Not getting enough to drink, or not urinating often. You have certain medical conditions, such as: Diabetes. A weak disease-fighting system (immunesystem). Sickle cell disease. Gout. Spinal cord injury. What are the signs or symptoms? Symptoms of this condition include: Needing to urinate right away (urgency). Frequent urination. This may include small amounts of urine each time you urinate. Pain or burning with urination. Blood in the urine. Urine that smells bad or unusual. Trouble urinating. Cloudy urine. Vaginal discharge, if you are female. Pain in the abdomen or the lower back. You may also have: Vomiting or a decreased appetite. Confusion. Irritability or tiredness. A fever or  chills. Diarrhea. The first symptom in older adults may be confusion. In some cases, they may not have any symptoms until the infection has worsened. How is this diagnosed? This condition is diagnosed based on your medical history and a physical exam. You may also have other tests, including: Urine tests. Blood tests. Tests for STIs (sexually transmitted infections). If you have had more than one UTI, a cystoscopy or imaging studies may be done to determine the cause of the infections. How is this treated? Treatment for this condition includes: Antibiotic medicine. Over-the-counter medicines to treat discomfort. Drinking enough water to stay hydrated. If you have frequent infections or have other conditions such as a kidney stone, you may need to see a health care provider who specializes in the urinary tract (urologist). In rare cases, urinary tract infections can cause sepsis. Sepsis is a life-threatening condition that occurs when the body responds to an infection. Sepsis is treated in the hospital with IV antibiotics, fluids, and other medicines. Follow these instructions at home:  Medicines Take over-the-counter and prescription medicines only as told by your health care provider. If you were prescribed an antibiotic medicine, take it as told by your health care provider. Do not stop using the antibiotic even if you start to feel better. General instructions Make sure you: Empty your bladder often and completely. Do not hold urine for long periods of time. Empty your bladder after sex. Wipe from front to back after urinating or having a bowel movement if you are female. Use each tissue only one time when you wipe. Drink enough fluid to keep your urine pale yellow. Keep all follow-up visits. This is important. Contact a health   care provider if: Your symptoms do not get better after 1-2 days. Your symptoms go away and then return. Get help right away if: You have severe pain in  your back or your lower abdomen. You have a fever or chills. You have nausea or vomiting. Summary A urinary tract infection (UTI) is an infection of any part of the urinary tract, which includes the kidneys, ureters, bladder, and urethra. Most urinary tract infections are caused by bacteria in your genital area. Treatment for this condition often includes antibiotic medicines. If you were prescribed an antibiotic medicine, take it as told by your health care provider. Do not stop using the antibiotic even if you start to feel better. Keep all follow-up visits. This is important. This information is not intended to replace advice given to you by your health care provider. Make sure you discuss any questions you have with your health care provider. Document Revised: 12/02/2019 Document Reviewed: 12/02/2019 Elsevier Patient Education  2023 [employer]  

## 2021-11-19 LAB — URINE CULTURE
MICRO NUMBER:: 13655706
Result:: NO GROWTH
SPECIMEN QUALITY:: ADEQUATE

## 2021-12-17 ENCOUNTER — Ambulatory Visit: Payer: 59 | Admitting: Obstetrics and Gynecology

## 2022-01-18 ENCOUNTER — Encounter (HOSPITAL_COMMUNITY): Payer: Self-pay | Admitting: Emergency Medicine

## 2022-01-18 ENCOUNTER — Emergency Department (HOSPITAL_COMMUNITY)
Admission: EM | Admit: 2022-01-18 | Discharge: 2022-01-18 | Disposition: A | Discharge status: Home / Self Care | Payer: Commercial Managed Care - HMO | Attending: Emergency Medicine | Admitting: Emergency Medicine

## 2022-01-18 DIAGNOSIS — W06XXXA Fall from bed, initial encounter: Secondary | ICD-10-CM | POA: Insufficient documentation

## 2022-01-18 DIAGNOSIS — Y92003 Bedroom of unspecified non-institutional (private) residence as the place of occurrence of the external cause: Secondary | ICD-10-CM | POA: Insufficient documentation

## 2022-01-18 DIAGNOSIS — S0181XA Laceration without foreign body of other part of head, initial encounter: Secondary | ICD-10-CM | POA: Insufficient documentation

## 2022-01-18 DIAGNOSIS — W19XXXA Unspecified fall, initial encounter: Secondary | ICD-10-CM

## 2022-01-18 DIAGNOSIS — S0081XA Abrasion of other part of head, initial encounter: Secondary | ICD-10-CM

## 2022-01-18 MED ORDER — ACETAMINOPHEN 325 MG PO TABS
650.0000 mg | ORAL_TABLET | Freq: Once | ORAL | Status: AC
  Administered 2022-01-18: 650 mg via ORAL
  Filled 2022-01-18: qty 2

## 2022-01-18 MED ORDER — LIDOCAINE-EPINEPHRINE (PF) 2 %-1:200000 IJ SOLN
20.0000 mL | Freq: Once | INTRAMUSCULAR | Status: AC
  Administered 2022-01-18: 20 mL
  Filled 2022-01-18: qty 20

## 2022-01-18 MED ORDER — LIDOCAINE-EPINEPHRINE-TETRACAINE (LET) TOPICAL GEL
3.0000 mL | Freq: Once | TOPICAL | Status: AC
  Administered 2022-01-18: 3 mL via TOPICAL
  Filled 2022-01-18: qty 3

## 2022-01-18 NOTE — ED Triage Notes (Addendum)
Patient here with complaint of right head injury sustained at approximately 0600 this morning when she fell out of bed and hit her head on her nightstand, approximately 3 cm by 2 cm laceration to right side of head. Patient denies LOC, is alert, oriented, ambulatory, and in no apparent distress at this time.

## 2022-01-18 NOTE — ED Provider Notes (Signed)
[facility] EMERGENCY DEPARTMENT Provider Note   CSN: [id number] Arrival date & time: 01/18/22  1443     History  Chief Complaint  Patient presents with   Wendy Medina is a 61 y.o. female.  HPI 61 year old female with a history of anxiety, ovarian cyst, dysmenorrhea, renal calculi presents to the ER after a fall.  Patient states that she was getting out of bed and her foot slipped out from under her and she fell onto the corner of her nightstand.  She did not lose consciousness.  She has a laceration to her right upper forehead.  Bleeding is controlled.  She denies any blurry vision, nausea, vomiting, chest pain, dizziness surrounding the fall and afterwards.  She states that she cannot remember exactly the date of her tetanus but knows that it was within the last 10 years.    Home Medications Prior to Admission medications   Medication Sig Start Date End Date Taking? Authorizing Provider  DEXILANT 60 MG capsule Take 1 capsule by mouth daily.  05/10/19   [provider]  Estradiol 10 MCG TABS vaginal tablet Place 1 tablet (10 mcg total) vaginally 2 (two) times a week. 12/13/20   Salvadore Dom, MD  nitrofurantoin, macrocrystal-monohydrate, (MACROBID) 100 MG capsule Take 1 capsule (100 mg total) by mouth 2 (two) times daily. 11/18/21   Salvadore Dom, MD  phenazopyridine (PYRIDIUM) 200 MG tablet Take 1 tablet (200 mg total) by mouth 3 (three) times daily with meals. 11/18/21   Salvadore Dom, MD  Probiotic Product (PROBIOTIC PO) Take by mouth.    [provider]  UNABLE TO FIND Allergy Injections once a month    [provider]      Allergies    Corn-containing products and Sulfa antibiotics    Review of Systems   Review of Systems Ten systems reviewed and are negative for acute change, except as noted in the HPI.   Physical Exam Updated Vital Signs BP 98/75   Pulse 65   Temp 98 F (36.7 C) (Oral)    Resp 14   LMP 09/28/2008 (Exact Date) Comment: has E-String  SpO2 100%  Physical Exam Vitals and nursing note reviewed.  Constitutional:      General: She is not in acute distress.    Appearance: She is well-developed.  HENT:     Head: Normocephalic and atraumatic.  Eyes:     Conjunctiva/sclera: Conjunctivae normal.  Cardiovascular:     Rate and Rhythm: Normal rate and regular rhythm.     Heart sounds: No murmur heard. Pulmonary:     Effort: Pulmonary effort is normal. No respiratory distress.     Breath sounds: Normal breath sounds.  Abdominal:     Palpations: Abdomen is soft.     Tenderness: There is no abdominal tenderness.  Musculoskeletal:        General: No swelling.     Cervical back: Neck supple.  Skin:    General: Skin is warm and dry.     Capillary Refill: Capillary refill takes less than 2 seconds.     Comments: 2 cm laceration to the right upper forehead, bleeding controlled  Neurological:     Mental Status: She is alert.  Psychiatric:        Mood and Affect: Mood normal.     ED Results / Procedures / Treatments   Labs (all labs ordered are listed, but only abnormal results are displayed) Labs Reviewed -  No data to display  EKG None  Radiology No results found.  Procedures .Marland KitchenLaceration Repair  Date/Time: 01/18/2022 12:21 PM  Performed by: Mare Ferrari, PA-C Authorized by: Mare Ferrari, PA-C   Consent:    Consent obtained:  Verbal   Consent given by:  Patient   Risks discussed:  Infection, need for additional repair, pain, poor cosmetic result and poor wound healing   Alternatives discussed:  No treatment and delayed treatment Universal protocol:    Procedure explained and questions answered to patient or proxy's satisfaction: yes     Relevant documents present and verified: yes     Test results available: yes     Imaging studies available: yes     Required blood products, implants, devices, and special equipment available: yes      Site/side marked: yes     Immediately prior to procedure, a time out was called: yes     Patient identity confirmed:  Verbally with patient Anesthesia:    Anesthesia method:  Local infiltration Laceration details:    Location:  Face   Face location:  Forehead (Right lateral forehead)   Length (cm):  2 Exploration:    Hemostasis achieved with:  LET   Imaging outcome: foreign body not noted     Wound exploration: wound explored through full range of motion   Treatment:    Area cleansed with:  Povidone-iodine   Amount of cleaning:  Standard Skin repair:    Repair method:  Sutures   Suture size:  5-0   Suture material:  Fast-absorbing gut   Suture technique:  Simple interrupted   Number of sutures:  5 Approximation:    Approximation:  Close Repair type:    Repair type:  Simple Post-procedure details:    Dressing:  Non-adherent dressing   Procedure completion:  Tolerated well, no immediate complications     Medications Ordered in ED Medications  lidocaine-EPINEPHrine-tetracaine (LET) topical gel (3 mLs Topical Given 01/18/22 1129)  lidocaine-EPINEPHrine (XYLOCAINE W/EPI) 2 %-1:200000 (PF) injection 20 mL (20 mLs Infiltration Given 01/18/22 1129)  acetaminophen (TYLENOL) tablet 650 mg (650 mg Oral Given 01/18/22 1129)    ED Course/ Medical Decision Making/ A&P                           Medical Decision Making Risk OTC drugs. Prescription drug management.   61 year old female presented to the ER after hitting her head on a nightstand.  Fall appears to be mechanical, patient reports that she had slipped.  She had no chest pain, dizziness, headache or shortness of breath.  She did not lose consciousness.  She does not have a headache, blurry vision, nausea, vomiting, or any other findings concerning for intracranial hemorrhage or cervical spine injury.  She was seen ambulate about the ER without difficulty.  Tetanus is up-to-date.  Laceration repaired without any complications.   Educated on signs of infection and return precautions.  She was understanding and is agreeable.  Stable for discharge. Final Clinical Impression(s) / ED Diagnoses Final diagnoses:  Fall, initial encounter  Forehead abrasion, initial encounter    Rx / DC Orders ED Discharge Orders     None         Mare Ferrari, PA-C 01/18/22 1226    Tegeler, Canary Brim, MD 01/18/22 [id number]

## 2022-01-18 NOTE — Discharge Instructions (Addendum)
Keep area clean by washing with soap and water daily. Do not submerge in water or scrub stitches Apply a bandage at least once daily, change more often if it is dirty Watch for signs of infection (redness, drainage, worsening pain) Take Tylenol or Ibuprofen for pain as needed Your stitches should dissolve on their own and do not need to be removed.

## 2022-01-23 NOTE — Progress Notes (Signed)
61 y.o. [id number] Married Declined Not Hispanic or Latino female here for annual exam.  No vaginal bleeding.   Patient needs a refill on her estradiol. She would also like cholesterol labs.   Seeing Wendy Medina for her osteoporosis.  Husband has parkinson's and dementia. He needs full time care, still at home.    Patient's last menstrual period was 09/28/2008 (exact date).          Sexually active: No.  The current method of family planning is post menopausal status.    Exercising: Yes.     Patient is very active  Smoker:  no  Health Maintenance: Pap:  12/13/19 neg, neg HPV; 11-18-17 neg HPV HR neg, 10/04/14 WNL HPV Neg  History of abnormal Pap:  no MMG:  02/13/21 density B Bi-rads 1 neg  BMD:   12/24/20 osteopenic  Colonoscopy:  2012 f/u 19yrs, endoscopy. She has a GI MD.   TDaP:  2016 Gardasil: NA   reports that she has never smoked. She has never used smokeless tobacco. She reports current alcohol use. She reports that she does not use drugs. She has a [place] that dose agility competition. Kids are out of state, no grandchildren.   Past Medical History:  Diagnosis Date   Anemia    Anxiety    with air travel    Dysmenorrhea    H/O renal calculi    Mastalgia    Ovarian cyst     Past Surgical History:  Procedure Laterality Date   COLONOSCOPY  10/25/10   negative recheckin 10 years   SKIN BIOPSY     WISDOM TOOTH EXTRACTION      Current Outpatient Medications  Medication Sig Dispense Refill   DEXILANT 60 MG capsule Take 1 capsule by mouth daily.      Probiotic Product (PROBIOTIC PO) Take by mouth.     UNABLE TO FIND Allergy Injections once a month     Estradiol 10 MCG TABS vaginal tablet Place 1 tablet (10 mcg total) vaginally 2 (two) times a week. 24 tablet 3   No current facility-administered medications for this visit.    Family History  Problem Relation Age of Onset   Hypertension Mother    Thyroid disease Mother    Hyperlipidemia Mother    Heart  attack Brother 64   Heart failure Paternal Uncle    Heart failure Maternal Grandmother    Stroke Paternal Grandmother     Review of Systems  All other systems reviewed and are negative.   Exam:   BP 110/68   Pulse 73   Ht 5\' 2"  (1.575 m)   Wt 118 lb (53.5 kg)   LMP 09/28/2008 (Exact Date) Comment: has E-String  SpO2 99%   BMI 21.58 kg/m   Weight change: @WEIGHTCHANGE @ Height:   Height: 5\' 2"  (157.5 cm)  Ht Readings from Last 3 Encounters:  01/30/22 5\' 2"  (1.575 m)  02/25/21 5' 3.5" (1.613 m)  12/12/20 5' 3.5" (1.613 m)    General appearance: alert, cooperative and appears stated age Head: Normocephalic, without obvious abnormality, atraumatic Neck: no adenopathy, supple, symmetrical, trachea midline and thyroid normal to inspection and palpation Lungs: clear to auscultation bilaterally Cardiovascular: regular rate and rhythm Breasts: normal appearance, no masses or tenderness Abdomen: soft, non-tender; non distended,  no masses,  no organomegaly Extremities: extremities normal, atraumatic, no cyanosis or edema Skin: Skin color, texture, turgor normal. No rashes or lesions Lymph nodes: Cervical, supraclavicular, and axillary nodes normal. No abnormal inguinal  nodes palpated Neurologic: Grossly normal   Pelvic: External genitalia:  no lesions              Urethra:  normal appearing urethra with no masses, tenderness or lesions              Bartholins and Skenes: normal                 Vagina: normal appearing vagina with normal color and discharge, no lesions              Cervix: no lesions               Bimanual Exam:  Uterus:  normal size, contour, position, consistency, mobility, non-tender              Adnexa: no mass, fullness, tenderness               Rectovaginal: Confirms               Anus:  normal sphincter tone, no lesions  Wendy Medina, CMA chaperoned for the exam.  1. Well woman exam Discussed breast self exam Discussed calcium and vit D intake No pap this  year Mammogram due next month  2. Colon cancer screening Discussed options - Cologuard  3. Vaginal atrophy - Estradiol 10 MCG TABS vaginal tablet; Place 1 tablet (10 mcg total) vaginally 2 (two) times a week.  Dispense: 24 tablet; Refill: 3  4. Elevated lipids She is on a mainly plant based diet. Not fasting.  - Lipid panel

## 2022-01-28 ENCOUNTER — Other Ambulatory Visit: Payer: Self-pay | Admitting: Obstetrics and Gynecology

## 2022-01-28 DIAGNOSIS — N952 Postmenopausal atrophic vaginitis: Secondary | ICD-10-CM

## 2022-01-29 ENCOUNTER — Ambulatory Visit: Payer: Commercial Managed Care - HMO | Admitting: Obstetrics and Gynecology

## 2022-01-30 ENCOUNTER — Encounter: Payer: Self-pay | Admitting: Obstetrics and Gynecology

## 2022-01-30 ENCOUNTER — Telehealth: Payer: Self-pay

## 2022-01-30 ENCOUNTER — Ambulatory Visit (INDEPENDENT_AMBULATORY_CARE_PROVIDER_SITE_OTHER): Payer: Commercial Managed Care - HMO | Admitting: Obstetrics and Gynecology

## 2022-01-30 VITALS — BP 110/68 | HR 73 | Ht 62.0 in | Wt 118.0 lb

## 2022-01-30 DIAGNOSIS — N952 Postmenopausal atrophic vaginitis: Secondary | ICD-10-CM | POA: Diagnosis not present

## 2022-01-30 DIAGNOSIS — E785 Hyperlipidemia, unspecified: Secondary | ICD-10-CM

## 2022-01-30 DIAGNOSIS — Z1211 Encounter for screening for malignant neoplasm of colon: Secondary | ICD-10-CM

## 2022-01-30 DIAGNOSIS — Z01419 Encounter for gynecological examination (general) (routine) without abnormal findings: Secondary | ICD-10-CM | POA: Diagnosis not present

## 2022-01-30 MED ORDER — ESTRADIOL 10 MCG VA TABS: 1.0000 | ORAL_TABLET | VAGINAL | 3 refills | Status: DC

## 2022-01-30 NOTE — Telephone Encounter (Signed)
Refill request received from [facility] [phone] fx [phone] for Estradiol 63mcg vaginal tab.  Patient has AEX scheduled today with Dr. Talbert Nan at 11:30.  Patient can get rf at her office visit.  Pharmacy informed.

## 2022-01-30 NOTE — Patient Instructions (Signed)

## 2022-01-31 LAB — LIPID PANEL
Cholesterol: 216 mg/dL — ABNORMAL HIGH (ref <200)
HDL: 75 mg/dL (ref >=50)
LDL Cholesterol (Calc): 123 mg/dL (calc) — ABNORMAL HIGH
Non-HDL Cholesterol (Calc): 141 mg/dL (calc) — ABNORMAL HIGH (ref <130)
Total CHOL/HDL Ratio: 2.9 (calc) (ref <5.0)
Triglycerides: 84 mg/dL (ref <150)

## 2022-02-14 LAB — COLOGUARD: COLOGUARD: NEGATIVE

## 2022-02-19 ENCOUNTER — Other Ambulatory Visit: Payer: Self-pay | Admitting: Obstetrics and Gynecology

## 2022-02-19 DIAGNOSIS — Z1231 Encounter for screening mammogram for malignant neoplasm of breast: Secondary | ICD-10-CM

## 2022-03-03 ENCOUNTER — Ambulatory Visit: Payer: 59 | Admitting: Internal Medicine

## 2022-04-08 ENCOUNTER — Ambulatory Visit
Admission: RE | Admit: 2022-04-08 | Discharge: 2022-04-08 | Disposition: A | Discharge status: Home / Self Care | Payer: Commercial Managed Care - HMO | Source: Ambulatory Visit | Attending: Obstetrics and Gynecology | Admitting: Obstetrics and Gynecology

## 2022-04-08 DIAGNOSIS — Z1231 Encounter for screening mammogram for malignant neoplasm of breast: Secondary | ICD-10-CM

## 2022-05-08 ENCOUNTER — Ambulatory Visit: Payer: 59 | Admitting: Internal Medicine

## 2022-06-18 ENCOUNTER — Encounter: Payer: Self-pay | Admitting: Internal Medicine

## 2022-06-18 ENCOUNTER — Ambulatory Visit: Payer: Commercial Managed Care - HMO | Admitting: Internal Medicine

## 2022-06-18 VITALS — BP 118/68 | HR 70 | Ht 62.0 in | Wt 122.0 lb

## 2022-06-18 DIAGNOSIS — M810 Age-related osteoporosis without current pathological fracture: Secondary | ICD-10-CM | POA: Diagnosis not present

## 2022-06-18 DIAGNOSIS — R519 Headache, unspecified: Secondary | ICD-10-CM | POA: Diagnosis not present

## 2022-06-18 LAB — BASIC METABOLIC PANEL
BUN: 13 mg/dL (ref 6–23)
CO2: 29 mEq/L (ref 19–32)
Calcium: 9.8 mg/dL (ref 8.4–10.5)
Chloride: 103 mEq/L (ref 96–112)
Creatinine, Ser: 0.86 mg/dL (ref 0.40–1.20)
GFR: 72.05 mL/min (ref >60.00)
Glucose, Bld: 89 mg/dL (ref 70–99)
Potassium: 5 mEq/L (ref 3.5–5.1)
Sodium: 137 mEq/L (ref 135–145)

## 2022-06-18 LAB — VITAMIN D 25 HYDROXY (VIT D DEFICIENCY, FRACTURES): VITD: 73.8 ng/mL (ref 30.00–100.00)

## 2022-06-18 NOTE — Progress Notes (Unsigned)
Patient ID: [name], female   DOB: February 07, 1961, 62 y.o.   MRN: [id number]   HPI  Wendy Medina is a 62 y.o.-year-old female, initially referred by her OB/GYN doctor, Dr. Talbert Medina, returning for follow-up for osteoporosis (OP).  Last visit 1 year and 4 months ago.  Interim history: No falls or fractures since last visit. No dizziness/vertigo/poor vision. She describes headaches when she does intermittent fasting. She uses a vibration platform, also runs, does weightbearing exercises and rides her bike. She has been taking care of her husband who has Alzheimer's dementia and getting worse.  He moved into the memory care facility 1 mo ago.  She now has more time for herself.  She has a Physiological scientist, starting to do more weightlifting.  Reviewed history: Pt was dx with OP in 12/2018.  I reviewed pt's DXA scans: Date L1-L4 T score FN T score 33% distal Radius FRAX  12/24/2020 (breast center) L1-L2: -2.4 RFN: -1.9 LFN: -1.3 N/a 10 year MOF: 28.1% 10-year hip fracture risk: 2.3%  12/21/2018 (breast center) L1-L2: -2.6 RFN: -1.8 LFN: -1.4 n/a    She has a history of right distal radial fracture 03/2018 - slipped and fell in the bathroom.  On glutathione and vitamins infusion.  Also takes magnesium.  No Previous OP treatments.  No h/o vitamin D deficiency. Reviewed available vit D levels: Lab Results  Component Value Date   VD25OH 55.7 05/11/2020   VD25OH 77.2 02/01/2020   VD25OH 32.7 12/07/2019   Pt was on vitamin D liquid 5000-15,000 units daily (recommended daily dose is 1 teaspoon and she is taking between 1 teaspoon and 1 tablespoon) >> 5000 units daily now.  She is on a mostly plant-based diet.  She does have acid reflux - on Dexilant - now 2x a week.  She does Pilates, Yoga, biking, tennis, and running - was training for a half marathon until she sprained her ankle.  However, after this healed, she ran a 5K.  She also  started weight bearing exercises during the  pandemic.   Menopause was at 62 y/o.  She is on vaginal estrogen.  FH of osteoporosis:  Mother with OP - developed in her 27s. Father broke his hip in his 59s and died soon afterwards.  No h/o hyper/hypocalcemia or hyperparathyroidism. No h/o kidney stones. Lab Results  Component Value Date   CALCIUM 9.3 12/12/2020   CALCIUM 9.8 12/07/2019   CALCIUM 9.9 11/09/2014   CALCIUM 10.0 09/14/2012   No h/o thyrotoxicosis. Reviewed TSH recent levels:  Lab Results  Component Value Date   TSH 2.249 09/14/2012   No h/o CKD. Last BUN/Cr: Lab Results  Component Value Date   BUN 12 12/12/2020   CREATININE 0.86 12/12/2020   She has a history of pancytopenia for which she saw hematology. She is on Dexilant for GERD - off and on. Had cough 2/2 GERD and allergies. Now controlled.  Her husband has Parkinson's disease.  ROS: + See HPI  I reviewed pt's medications, allergies, PMH, social hx, family hx, and changes were documented in the history of present illness. Otherwise, unchanged from my initial visit note.  Past Medical History:  Diagnosis Date   Anemia    Anxiety    with air travel    Dysmenorrhea    H/O renal calculi    Mastalgia    Ovarian cyst    Past Surgical History:  Procedure Laterality Date   COLONOSCOPY  10/25/10   negative recheckin 10 years  SKIN BIOPSY     WISDOM TOOTH EXTRACTION     Social History   Socioeconomic History   Marital status: Married    Spouse name: Not on file   Number of children: 2   Years of education: Not on file   Highest education level: Not on file  Occupational History   Occupation: Retired  Tobacco Use   Smoking status: Never   Smokeless tobacco: Never  Substance and Sexual Activity   Alcohol use: Yes    Alcohol/week: 0.0 - 1.0 standard drinks of alcohol    Comment: rarely   Drug use: No   Sexual activity: Yes    Partners: Male    Birth control/protection: Post-menopausal  Other Topics Concern   Not on file  Social  History Narrative   Not on file   Social Determinants of Health   Financial Resource Strain: Not on file  Food Insecurity: Not on file  Transportation Needs: Not on file  Physical Activity: Not on file  Stress: Not on file  Social Connections: Not on file  Intimate Partner Violence: Not on file   Current Outpatient Medications on File Prior to Visit  Medication Sig Dispense Refill   DEXILANT 60 MG capsule Take 1 capsule by mouth daily.      Estradiol 10 MCG TABS vaginal tablet Place 1 tablet (10 mcg total) vaginally 2 (two) times a week. 24 tablet 3   Probiotic Product (PROBIOTIC PO) Take by mouth.     UNABLE TO FIND Allergy Injections once a month     No current facility-administered medications on file prior to visit.   Allergies  Allergen Reactions   Corn-Containing Products Shortness Of Breath   Sulfa Antibiotics    Family History  Problem Relation Age of Onset   Hypertension Mother    Thyroid disease Mother    Hyperlipidemia Mother    Heart attack Brother 45   Heart failure Paternal Uncle    Heart failure Maternal Grandmother    Stroke Paternal Grandmother    PE: BP 118/68 (BP Location: Right Arm, Patient Position: Sitting, Cuff Size: Normal)   Pulse 70   Ht 5' 2"$  (1.575 m)   LMP 09/28/2008 (Exact Date) Comment: has E-String  SpO2 99%   BMI 21.58 kg/m  Wt Readings from Last 3 Encounters:  01/30/22 118 lb (53.5 kg)  11/18/21 119 lb (54 kg)  02/25/21 120 lb 6.4 oz (54.6 kg)   Constitutional: normal weight, fit, in NAD Eyes:  EOMI, no exophthalmos ENT: no neck masses, no cervical lymphadenopathy Cardiovascular: RRR, No MRG Respiratory: CTA B Musculoskeletal: no deformities Skin:no rashes Neurological: no tremor with outstretched hands  Assessment: 1. Osteoporosis  2. HAs  Plan: 1. Osteoporosis -Likely postmenopausal/age-related; she also has family history of osteoporosis in her parents -Her bone density scan from 2020 revealed an increased risk  for fractures since the T-scores are lower than -2.5.  However, she does have small bones and her bone density was likely to have been read lower than it actually was.  Also her risk of fracture is actually improved by the very active lifestyle and her consistent exercise including weightbearing exercises.  Also, only 2 vertebrae were analyzed, which is suboptimal.  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  However, she had another bone density scan in 12/2020 which showed slight improvement at the spine level, with a T-score being  out of the osteoporotic range, and -2.4 and a non statistically significant worsening at the level of the R femoral neck, of -0.1%. -She is getting a good amount of calcium in her diet and also her vitamin D level was normal.  She is on 5000 units vitamin D daily. -She was previously on vitamin A high dose and I advised her to stop since this was associated with fractures -She is staying away from smoking, alcohol, and follows a plant-based diet -She continues to exercise including weightbearing exercises (now with a personal trainer) and we discussed in the past about avoiding excessive bending/lifting/turning -No falls or fractures since last visit -We discussed about possible treatments for osteoporosis.  Since she has GERD, she is not an ideal candidate for p.o. bisphosphonate.  We discussed about using denosumab versus zoledronic acid, mechanism of action, expected benefits, and possible side effects.  At last visit she wanted to avoid medications.  She started to use a vibration platform twice a week and I also suggested [facility] center for skeletal loading.  Given brochure.  She did not check into this as she was very busy with her husband.  She would like to do that now. -At today's visit, we discussed that she is due for another bone density this summer.  I advised her  to send me a message through MyChart so I can order this. -At today's visit, we will check her calcium and vitamin D level again -I will see her back in 1 year  2.  Headaches -She asks me my opinion about possible reasons for her headaches when she does intermittent fasting.  Upon questioning, she is trying to stay hydrated but stops drinking water several hours before going to bed to avoid waking up too frequently during the night.  As of now, she has twice a night nocturia.  We did discuss that the headaches could be related to dehydration and she will try to drink water before she goes to bed, also.  Philemon Kingdom, MD PhD [facility]

## 2022-06-18 NOTE — Patient Instructions (Addendum)
Please look into [facility].  Please send me a message in 12/2022 to order the bone density test.  Please stop at the lab.  Please come back for a follow-up appointment in 1 year.

## 2023-01-20 ENCOUNTER — Telehealth: Payer: Self-pay | Admitting: Internal Medicine

## 2023-01-20 ENCOUNTER — Other Ambulatory Visit: Payer: Self-pay | Admitting: Internal Medicine

## 2023-01-20 DIAGNOSIS — M810 Age-related osteoporosis without current pathological fracture: Secondary | ICD-10-CM

## 2023-01-20 NOTE — Telephone Encounter (Signed)
Patient is calling to say that she thinks it is time for her to have another bone density test and if so would like to get that scheduled.

## 2023-01-20 NOTE — Telephone Encounter (Signed)
I spoke to [facility] and she is aware that bone dx has been ordered at the breast center

## 2023-02-05 ENCOUNTER — Ambulatory Visit: Payer: Commercial Managed Care - HMO | Admitting: Obstetrics and Gynecology

## 2023-02-11 ENCOUNTER — Other Ambulatory Visit: Payer: Self-pay | Admitting: *Deleted

## 2023-02-11 DIAGNOSIS — N952 Postmenopausal atrophic vaginitis: Secondary | ICD-10-CM

## 2023-02-11 MED ORDER — ESTRADIOL 10 MCG VA TABS: 1.0000 | ORAL_TABLET | VAGINAL | 0 refills | Status: DC

## 2023-02-11 NOTE — Telephone Encounter (Signed)
Med refill request:Estradiol 10 mcg vag tab Last AEX: 01/30/22 -JJ Next AEX: 02/19/23 -JC Last MMG (if hormonal med) 04/08/22 -BiRads 1 neg Refill authorized: Please Advise?

## 2023-02-19 ENCOUNTER — Ambulatory Visit (INDEPENDENT_AMBULATORY_CARE_PROVIDER_SITE_OTHER): Payer: Commercial Managed Care - HMO | Admitting: Radiology

## 2023-02-19 ENCOUNTER — Encounter: Payer: Self-pay | Admitting: Radiology

## 2023-02-19 VITALS — BP 94/58 | Ht 62.5 in | Wt 120.0 lb

## 2023-02-19 DIAGNOSIS — M810 Age-related osteoporosis without current pathological fracture: Secondary | ICD-10-CM | POA: Diagnosis not present

## 2023-02-19 DIAGNOSIS — Z01419 Encounter for gynecological examination (general) (routine) without abnormal findings: Secondary | ICD-10-CM | POA: Diagnosis not present

## 2023-02-19 DIAGNOSIS — N952 Postmenopausal atrophic vaginitis: Secondary | ICD-10-CM

## 2023-02-19 MED ORDER — ESTRADIOL 10 MCG VA TABS: 1.0000 | ORAL_TABLET | VAGINAL | 4 refills | Status: DC

## 2023-02-19 NOTE — Progress Notes (Signed)
   Wendy Medina 11-Jul-1960 [id number]   History: Postmenopausal 62 y.o. presents for annual exam. No gyn concerns. Estradiol vaginal tablets twice weekly, needs a refill. Husband passed away 12-12-22. She is very active, plays pickleball, runs and lifts weights. Competes with her dog in agility competitions.    Gynecologic History Postmenopausal Last Pap: 2021. Results were: normal/HPV neg Last mammogram: 2023. Results were: normal Last colonoscopy: 2012 normal, cologuard 2023 negative DEXA:2022 osteopenia    Obstetric History OB History  Gravida Para Term Preterm AB Living  2 2 2  0 0 2  SAB IAB Ectopic Multiple Live Births  0 0 0 0 2    # Outcome Date GA Lbr Len/2nd Weight Sex Type Anes PTL Lv  2 Term 1995    F Vag-Spont   LIV  1 Term 41    M Vag-Spont   LIV     The following portions of the patient's history were reviewed and updated as appropriate: allergies, current medications, past family history, past medical history, past social history, past surgical history, and problem list.  Review of Systems Pertinent items noted in HPI and remainder of comprehensive ROS otherwise negative.  Past medical history, past surgical history, family history and social history were all reviewed and documented in the EPIC chart.  Exam:  Vitals:   02/19/23 0757  BP: (!) 94/58  Weight: 120 lb (54.4 kg)  Height: 5' 2.5" (1.588 m)   Body mass index is 21.6 kg/m.  General appearance:  Normal Thyroid:  Symmetrical, normal in size, without palpable masses or nodularity. Respiratory  Auscultation:  Clear without wheezing or rhonchi Cardiovascular  Auscultation:  Regular rate, without rubs, murmurs or gallops  Edema/varicosities:  Not grossly evident Abdominal  Soft,nontender, without masses, guarding or rebound.  Liver/spleen:  No organomegaly noted  Hernia:  None appreciated  Skin  Inspection:  Grossly normal Breasts: Examined lying and sitting.   Right: Without masses,  retractions, nipple discharge or axillary adenopathy.   Left: Without masses, retractions, nipple discharge or axillary adenopathy. Genitourinary   Inguinal/mons:  Normal without inguinal adenopathy  External genitalia:  Normal appearing vulva with no masses, tenderness, or lesions  BUS/Urethra/Skene's glands:  Normal  Vagina:  Normal appearing with normal color and discharge, no lesions. Atrophy: mild   Cervix:  Normal appearing without discharge or lesions  Uterus:  Normal in size, shape and contour.  Midline and mobile, nontender  Adnexa/parametria:     Rt: Normal in size, without masses or tenderness.   Lt: Normal in size, without masses or tenderness.  Anus and perineum: Normal    Raynelle Fanning, CMA present for exam  Assessment/Plan:   1. Well woman exam with routine gynecological exam Pap 2026 Mammogram yearly Labs with PCP  2. Vaginal atrophy - Estradiol 10 MCG TABS vaginal tablet; Place 1 tablet (10 mcg total) vaginally 2 (two) times a week.  Dispense: 24 tablet; Refill: 4  3. Age-related osteoporosis without current pathological fracture Improved to osteopenia in 2022, has repeat scheduled 05/2023, managed by endocrinology    Discussed SBE, colonoscopy and DEXA screening as directed. Recommend of exercise weekly, including weight bearing exercise. Encouraged the use of seatbelts and sunscreen.  Return in 1 year for annual or sooner prn.  Tanda Rockers WHNP-BC, 8:35 AM 02/19/2023

## 2023-02-19 NOTE — Patient Instructions (Signed)
Preventive Care 40-64 Years Old, Female Preventive care refers to lifestyle choices and visits with your health care provider that can promote health and wellness. Preventive care visits are also called wellness exams. What can I expect for my preventive care visit? Counseling Your health care provider may ask you questions about your: Medical history, including: Past medical problems. Family medical history. Pregnancy history. Current health, including: Menstrual cycle. Method of birth control. Emotional well-being. Home life and relationship well-being. Sexual activity and sexual health. Lifestyle, including: Alcohol, nicotine or tobacco, and drug use. Access to firearms. Diet, exercise, and sleep habits. Work and work environment. Sunscreen use. Safety issues such as seatbelt and bike helmet use. Physical exam Your health care provider will check your: Height and weight. These may be used to calculate your BMI (body mass index). BMI is a measurement that tells if you are at a healthy weight. Waist circumference. This measures the distance around your waistline. This measurement also tells if you are at a healthy weight and may help predict your risk of certain diseases, such as type 2 diabetes and high blood pressure. Heart rate and blood pressure. Body temperature. Skin for abnormal spots. What immunizations do I need?  Vaccines are usually given at various ages, according to a schedule. Your health care provider will recommend vaccines for you based on your age, medical history, and lifestyle or other factors, such as travel or where you work. What tests do I need? Screening Your health care provider may recommend screening tests for certain conditions. This may include: Lipid and cholesterol levels. Diabetes screening. This is done by checking your blood sugar (glucose) after you have not eaten for a while (fasting). Pelvic exam and Pap test. Hepatitis B test. Hepatitis C  test. HIV (human immunodeficiency virus) test. STI (sexually transmitted infection) testing, if you are at risk. Lung cancer screening. Colorectal cancer screening. Mammogram. Talk with your health care provider about when you should start having regular mammograms. This may depend on whether you have a family history of breast cancer. BRCA-related cancer screening. This may be done if you have a family history of breast, ovarian, tubal, or peritoneal cancers. Bone density scan. This is done to screen for osteoporosis. Talk with your health care provider about your test results, treatment options, and if necessary, the need for more tests. Follow these instructions at home: Eating and drinking  Eat a diet that includes fresh fruits and vegetables, whole grains, lean protein, and low-fat dairy products. Take vitamin and mineral supplements as recommended by your health care provider. Do not drink alcohol if: Your health care provider tells you not to drink. You are pregnant, may be pregnant, or are planning to become pregnant. If you drink alcohol: Limit how much you have to 0-1 drink a day. Know how much alcohol is in your drink. In the [place] one drink equals one 12 oz bottle of beer (355 mL), one 5 oz glass of wine (148 mL), or one 1 oz glass of hard liquor (44 mL). Lifestyle Brush your teeth every morning and night with fluoride toothpaste. Floss one time each day. Exercise for at least 30 minutes 5 or more days each week. Do not use any products that contain nicotine or tobacco. These products include cigarettes, chewing tobacco, and vaping devices, such as e-cigarettes. If you need help quitting, ask your health care provider. Do not use drugs. If you are sexually active, practice safe sex. Use a condom or other form of protection to   prevent STIs. If you do not wish to become pregnant, use a form of birth control. If you plan to become pregnant, see your health care provider for a  prepregnancy visit. Take aspirin only as told by your health care provider. Make sure that you understand how much to take and what form to take. Work with your health care provider to find out whether it is safe and beneficial for you to take aspirin daily. Find healthy ways to manage stress, such as: Meditation, yoga, or listening to music. Journaling. Talking to a trusted person. Spending time with friends and family. Minimize exposure to UV radiation to reduce your risk of skin cancer. Safety Always wear your seat belt while driving or riding in a vehicle. Do not drive: If you have been drinking alcohol. Do not ride with someone who has been drinking. When you are tired or distracted. While texting. If you have been using any mind-altering substances or drugs. Wear a helmet and other protective equipment during sports activities. If you have firearms in your house, make sure you follow all gun safety procedures. Seek help if you have been physically or sexually abused. What's next? Visit your health care provider once a year for an annual wellness visit. Ask your health care provider how often you should have your eyes and teeth checked. Stay up to date on all vaccines. This information is not intended to replace advice given to you by your health care provider. Make sure you discuss any questions you have with your health care provider. Document Revised: 10/17/2020 Document Reviewed: 10/17/2020 Elsevier Patient Education  2024 Elsevier Inc.  

## 2023-04-23 ENCOUNTER — Other Ambulatory Visit: Payer: Self-pay | Admitting: Radiology

## 2023-04-23 DIAGNOSIS — Z1231 Encounter for screening mammogram for malignant neoplasm of breast: Secondary | ICD-10-CM

## 2023-05-15 ENCOUNTER — Ambulatory Visit: Payer: Commercial Managed Care - HMO

## 2023-05-29 ENCOUNTER — Ambulatory Visit
Admission: RE | Admit: 2023-05-29 | Discharge: 2023-05-29 | Disposition: A | Discharge status: Home / Self Care | Payer: Commercial Managed Care - HMO | Source: Ambulatory Visit | Attending: Radiology | Admitting: Radiology

## 2023-05-29 DIAGNOSIS — Z1231 Encounter for screening mammogram for malignant neoplasm of breast: Secondary | ICD-10-CM

## 2023-06-19 ENCOUNTER — Ambulatory Visit: Payer: Commercial Managed Care - HMO | Admitting: Internal Medicine

## 2023-08-03 ENCOUNTER — Ambulatory Visit
Admission: RE | Admit: 2023-08-03 | Discharge: 2023-08-03 | Disposition: A | Discharge status: Home / Self Care | Payer: Commercial Managed Care - HMO | Source: Ambulatory Visit | Attending: Internal Medicine | Admitting: Internal Medicine

## 2023-08-03 DIAGNOSIS — M810 Age-related osteoporosis without current pathological fracture: Secondary | ICD-10-CM

## 2023-08-07 ENCOUNTER — Encounter: Payer: Self-pay | Admitting: Internal Medicine

## 2023-08-07 ENCOUNTER — Ambulatory Visit: Payer: Commercial Managed Care - HMO | Admitting: Internal Medicine

## 2023-08-07 VITALS — BP 100/60 | HR 65 | Ht 62.5 in | Wt 123.4 lb

## 2023-08-07 DIAGNOSIS — M810 Age-related osteoporosis without current pathological fracture: Secondary | ICD-10-CM | POA: Diagnosis not present

## 2023-08-07 NOTE — Progress Notes (Addendum)
 Patient ID: Wendy Medina, female   DOB: 1960/07/02, 63 y.o.   MRN: [id number]   HPI  Wendy Medina is a 63 y.o.-year-old female, initially referred by her OB/GYN doctor, Dr. Oscar La, returning for follow-up for osteoporosis (OP).  Last visit 1 year ago.  Interim history: No falls or fractures since last visit. No dizziness/vertigo/poor vision. She uses a vibration platform, also walks more than 2 miles a day with her dog (with a weighted vest), does weightbearing exercises with a personal trainer and rides her bike. At last visits she was very stressed taking care of her husband who had Alzheimer's dementia and getting worse. He passed away last summer, at 63 years old.  Her 64 year old mother lives in town and is very independent.  For her 90th birthday, last year, they went to [place] in [place].  Reviewed history: Pt was dx with OP in 12/2018.  I reviewed pt's DXA scans: Date L1-L4 T score FN T score 33% distal Radius FRAX  08/03/2023 (breast center) L1-L2: -2.5 (-0.5%) RFN: -1.9 LFN: -1.4 N/a N/a  12/24/2020 (bre[facility] center) L1-L2: -2.4 RFN: -1.9 LFN: -1.3 N/a 10 year MOF: 28.1% 10-year hip fracture risk: 2.3%  12/21/2018 (breast center) L1-L2: -2.6 RFN: -1.8 LFN: -1.4 n/a    She has a history of right distal radial fracture 63/2019 - slipped and fell in the bathroom and hit her forearm on the edge of a marble ledge.  No Previous OP treatments.  No h/o vitamin D deficiency. Reviewed available vit D levels: Lab Results  Component Value Date   VD25OH 73.80 06/18/2022   VD25OH 55.7 05/11/2020   VD25OH 77.2 02/01/2020   VD25OH 32.7 12/07/2019   Pt was on vitamin D liquid 5000-15,000 units daily (recommended daily dose is 1 teaspoon and she is taking between 1 teaspoon and 1 tablespoon) >> 5000 units daily now.  She is on a mostly plant-based diet.  She does have acid reflux - was on Dexilant 2x a week >> was able to stop.  She does Pilates, Yoga, biking, tennis, and  running - was training for a half marathon until she sprained her ankle.  However, after this healed, she ran a 5K. She is walking (weighted vest), pilates).  She also  started weight bearing exercises during the pandemic.   Menopause was at 63 y/o.  She is on vaginal estrogen.  FH of osteoporosis:  Mother with OP - developed in her 78s. Father broke his hip in his 73s and died soon afterwards.  No h/o hyper/hypocalcemia or hyperparathyroidism. No h/o kidney stones. Lab Results  Component Value Date   CALCIUM 9.8 06/18/2022   CALCIUM 9.3 12/12/2020   CALCIUM 9.8 12/07/2019   CALCIUM 9.9 11/09/2014   CALCIUM 10.0 09/14/2012   No h/o thyrotoxicosis. Reviewed TSH recent levels:  Lab Results  Component Value Date   TSH 2.249 09/14/2012   No h/o CKD. Last BUN/Cr: Lab Results  Component Value Date   BUN 13 06/18/2022   CREATININE 0.86 06/18/2022   She has a history of pancytopenia for which she saw hematology. She was on Dexilant for GERD - off and on for 10 years.  Since last visit, she was able to stop Dexilant.  She had difficulty for a week, but apple cider vinegar helped.  As of now, she is asymptomatic.  ROS: + See HPI  I reviewed pt's medications, allergies, PMH, social hx, family hx, and changes were documented in the history of present illness. Otherwise, unchanged from  my initial visit note.  Past Medical History:  Diagnosis Date   Anemia    Anxiety    with air travel    Dysmenorrhea    H/O renal calculi    Mastalgia    Ovarian cyst    Past Surgical History:  Procedure Laterality Date   COLONOSCOPY  10/25/10   negative recheckin 10 years   SKIN BIOPSY     WISDOM TOOTH EXTRACTION     Social History   Socioeconomic History   Marital status: Married    Spouse name: Not on file   Number of children: 2   Years of education: Not on file   Highest education level: Not on file  Occupational History   Occupation: Retired  Tobacco Use   Smoking status: Never     Passive exposure: Never   Smokeless tobacco: Never  Substance and Sexual Activity   Alcohol use: Not Currently    Alcohol/week: 0.0 - 1.0 standard drinks of alcohol    Comment: rarely   Drug use: No   Sexual activity: Not Currently    Partners: Male    Birth control/protection: Post-menopausal    Comment: husband passed away 11/26/22, menarche 11yo, sexual debut 63yo  Other Topics Concern   Not on file  Social History Narrative   Not on file   Social Drivers of Health   Financial Resource Strain: Not on file  Food Insecurity: Not on file  Transportation Needs: Not on file  Physical Activity: Not on file  Stress: Not on file  Social Connections: Not on file  Intimate Partner Violence: Not on file   Current Outpatient Medications on File Prior to Visit  Medication Sig Dispense Refill   Estradiol 10 MCG TABS vaginal tablet Place 1 tablet (10 mcg total) vaginally 2 (two) times a week. 24 tablet 4   Probiotic Product (PROBIOTIC PO) Take by mouth.     UNABLE TO FIND Allergy Injections once a month     No current facility-administered medications on file prior to visit.   Allergies  Allergen Reactions   Corn-Containing Products Shortness Of Breath   Sulfa Antibiotics    Family History  Problem Relation Age of Onset   Hypertension Mother    Thyroid disease Mother    Hyperlipidemia Mother    Heart attack Brother 20   Heart failure Paternal Uncle    Heart failure Maternal Grandmother    Stroke Paternal Grandmother    PE: BP 100/60   Pulse 65   Ht 5' 2.5" (1.588 m)   Wt 123 lb 6.4 oz (56 kg)   LMP 09/28/2008 (Exact Date) Comment: has E-String  SpO2 95%   BMI 22.21 kg/m  Wt Readings from Last 3 Encounters:  08/07/23 123 lb 6.4 oz (56 kg)  02/19/23 120 lb (54.4 kg)  06/18/22 122 lb (55.3 kg)   Constitutional: normal weight, fit, in NAD Eyes:  EOMI, no exophthalmos ENT: no neck masses, no cervical lymphadenopathy Cardiovascular: RRR, No MRG Respiratory: CTA  B Musculoskeletal: no deformities Skin:no rashes Neurological: no tremor with outstretched hands  Assessment: 1. Osteoporosis  Plan: 1. Osteoporosis - Likely postmenopausal/age-related, she also has family history of osteoporosis in both of her parents.  She was on Dexilant for a decade, recently was able to stop. - Her DXA scan from 2020 revealed an increased risk for fracture as her T-scores were lower than -2.5.  However, we discussed that she has small bones and her bone density was likely read lower than  it actually was due to this.  Also, her risk of fracture is actually improved by her very active lifestyle and consistent exercise including weightbearing exercises.  Only 2 vertebrae were analyzed in 2020, which is suboptimal.  She has osteoarthritis in her back and calcifications at the level of the L3 and L4 vertebrae so they had to be excluded from analysis.  She had another bone density scan in 12/2020 which showed slight improvement at the spine level, with a T-score out of the osteoporotic range, at -2.4 and not statistically significant worsening at the level of the right femoral neck, -0.1%.  Since last visit, she had another bone density which showed statistically significant decrease in bone density at the level of the spine (still only 2 vertebrae analyzed), from -2.4 to -2.5 and at the level of the left FN, from -1.3 to -1.4.  Right FN T-score was stable, at -1.9. -She is getting a good amount of calcium in her diet and her vitamin D levels were normal.  She continues on 5000 units daily -She was previously on vitamin a high dose and I advised her to stop due to increased risk of osteoporosis and fracture - She is not smoking, drinking alcohol and also follows a plant-based diet -She continues to exercise consistently, including weightbearing exercises with a trainer.  We discussed in the past about avoiding excessive bending-lifting-turning.  - No falls or fractures since last  visit  -We previously discussed the possible treatments for osteoporosis.  Due to history of GERD, she is not an ideal candidate for p.o. bisphosphonate.  We discussed about using denosumab versus zoledronic acid.  I explained the mechanism of action, expected benefits and possible side effects.  However, she decided to avoid pharmaceutical treatment and started to use a vibration platform twice a week.  At last visits, I also suggested [facility] for skeletal loading and gave her a brochure.  She did not start this. - At today's visit we discussed about continuing to follow her bone densities every 2 years. - Will recheck her vitamin D level today and see if we need to decrease her vitamin D supplement. -I will see her back in 1 year  Orders Placed This Encounter  Procedures   Basic Metabolic Panel Without GFR   VITAMIN D 25 Hydroxy (Vit-D Deficiency, Fractures)   Component     Latest Ref Rng 08/07/2023  Glucose     65 - 99 mg/dL 478 (H)   BUN     7 - 25 mg/dL 16   Creatinine     2.95 - 1.05 mg/dL 6.21   BUN/Creatinine Ratio     6 - 22 (calc) SEE NOTE:   Sodium     135 - 146 mmol/L 138   Potassium     3.5 - 5.3 mmol/L 4.8   Chloride     98 - 110 mmol/L 102   CO2     20 - 32 mmol/L 29   Calcium     8.6 - 10.4 mg/dL 9.9   Vitamin D, 30-QMVHQIO     30 - 100 ng/mL 89   Labs are normal (not a fasting sample). However, vitamin D is high in the normal range.  I would advise her to take the 5000 units supplement every other day.  Carlus Pavlov, MD PhD [facility] Endocrinology

## 2023-08-07 NOTE — Patient Instructions (Addendum)
 Please stop at the lab.  Please come back for a follow-up appointment in 1 year.

## 2023-08-08 LAB — BASIC METABOLIC PANEL WITHOUT GFR
BUN: 16 mg/dL (ref 7–25)
CO2: 29 mmol/L (ref 20–32)
Calcium: 9.9 mg/dL (ref 8.6–10.4)
Chloride: 102 mmol/L (ref 98–110)
Creat: 0.89 mg/dL (ref 0.50–1.05)
Glucose, Bld: 100 mg/dL — ABNORMAL HIGH (ref 65–99)
Potassium: 4.8 mmol/L (ref 3.5–5.3)
Sodium: 138 mmol/L (ref 135–146)

## 2023-08-08 LAB — VITAMIN D 25 HYDROXY (VIT D DEFICIENCY, FRACTURES): Vit D, 25-Hydroxy: 89 ng/mL (ref 30–100)

## 2023-08-10 ENCOUNTER — Encounter: Payer: Self-pay | Admitting: Internal Medicine

## 2024-02-24 ENCOUNTER — Encounter: Payer: Self-pay | Admitting: Radiology

## 2024-02-24 ENCOUNTER — Ambulatory Visit (INDEPENDENT_AMBULATORY_CARE_PROVIDER_SITE_OTHER): Payer: Commercial Managed Care - HMO | Admitting: Radiology

## 2024-02-24 VITALS — BP 102/62 | HR 74 | Ht 63.0 in | Wt 110.0 lb

## 2024-02-24 DIAGNOSIS — N952 Postmenopausal atrophic vaginitis: Secondary | ICD-10-CM | POA: Diagnosis not present

## 2024-02-24 DIAGNOSIS — Z1331 Encounter for screening for depression: Secondary | ICD-10-CM

## 2024-02-24 DIAGNOSIS — Z01419 Encounter for gynecological examination (general) (routine) without abnormal findings: Secondary | ICD-10-CM | POA: Diagnosis not present

## 2024-02-24 MED ORDER — ESTRADIOL 10 MCG VA TABS: 1.0000 | ORAL_TABLET | VAGINAL | 4 refills | Status: AC

## 2024-02-24 NOTE — Progress Notes (Signed)
   Wendy Medina Nov 07, 1960 [id number]   History: Postmenopausal 63 y.o. presents for annual exam. No gyn concerns. Estradiol  vaginal tablets twice weekly, needs a refill. DEXA slightly worse in the hip and spine, managed by endo. Very active, trains her dogs for agility, works with a Psychologist, educational and plays pickleball multiple times a week. At risk for falls.  Gynecologic History Postmenopausal Last Pap: 2021. Results were: normal/HPV neg Last mammogram: 05/2023. Results were: normal Last colonoscopy: 2012 normal, cologuard 2023 negative DEXA:2022 osteopenia    Obstetric History OB History  Gravida Para Term Preterm AB Living  2 2 2  0 0 2  SAB IAB Ectopic Multiple Live Births  0 0 0 0 2    # Outcome Date GA Lbr Len/2nd Weight Sex Type Anes PTL Lv  2 Term 1995    F Vag-Spont   LIV  1 Term 19    M Vag-Spont   LIV      02/24/2024    8:16 AM  Depression screen PHQ 2/9  Decreased Interest 0  Down, Depressed, Hopeless 0  PHQ - 2 Score 0     The following portions of the patient's history were reviewed and updated as appropriate: allergies, current medications, past family history, past medical history, past social history, past surgical history, and problem list.  Review of Systems Pertinent items noted in HPI and remainder of comprehensive ROS otherwise negative.  Past medical history, past surgical history, family history and social history were all reviewed and documented in the EPIC chart.  Exam:  Vitals:   02/24/24 0810  BP: 102/62  Pulse: 74  SpO2: 99%  Weight: 110 lb (49.9 kg)  Height: 5' 3 (1.6 m)   Body mass index is 19.49 kg/m.  General appearance:  Normal Thyroid :  Symmetrical, normal in size, without palpable masses or nodularity. Respiratory  Auscultation:  Clear without wheezing or rhonchi Cardiovascular  Auscultation:  Regular rate, without rubs, murmurs or gallops  Edema/varicosities:  Not grossly evident Abdominal  Soft,nontender, without masses,  guarding or rebound.  Liver/spleen:  No organomegaly noted  Hernia:  None appreciated  Skin  Inspection:  Grossly normal Breasts: Examined lying and sitting.   Right: Without masses, retractions, nipple discharge or axillary adenopathy.   Left: Without masses, retractions, nipple discharge or axillary adenopathy. Genitourinary   Inguinal/mons:  Normal without inguinal adenopathy  External genitalia:  Normal appearing vulva with no masses, tenderness, or lesions  BUS/Urethra/Skene's glands:  Normal  Vagina:  Normal appearing with normal color and discharge, no lesions. Atrophy: mild   Cervix:  Normal appearing without discharge or lesions  Uterus:  Normal in size, shape and contour.  Midline and mobile, nontender  Adnexa/parametria:     Rt: Normal in size, without masses or tenderness.   Lt: Normal in size, without masses or tenderness.  Anus and perineum: Normal    Darice Hoit, CMA present for exam  Assessment/Plan:   1. Well woman exam with routine gynecological exam (Primary)  2. Vaginal atrophy - Estradiol  10 MCG TABS vaginal tablet; Place 1 tablet (10 mcg total) vaginally 2 (two) times a week.  Dispense: 24 tablet; Refill: 4  3. Depression screen   Return in 1 year for annual or sooner prn.  Janit Cutter B WHNP-BC, 8:18 AM 02/24/2024

## 2024-02-24 NOTE — Progress Notes (Signed)
 P 12/13/19 normal, HPV negative M 05/29/23 C 10/25/10, cologuard 02/07/22 negative

## 2024-02-24 NOTE — Patient Instructions (Signed)
 Preventive Care 58-64 Years Old, Female  Preventive care refers to lifestyle choices and visits with your health care provider that can promote health and wellness. Preventive care visits are also called wellness exams.  What can I expect for my preventive care visit?  Counseling  Your health care provider may ask you questions about your:  Medical history, including:  Past medical problems.  Family medical history.  Pregnancy history.  Current health, including:  Menstrual cycle.  Method of birth control.  Emotional well-being.  Home life and relationship well-being.  Sexual activity and sexual health.  Lifestyle, including:  Alcohol, nicotine or tobacco, and drug use.  Access to firearms.  Diet, exercise, and sleep habits.  Work and work Astronomer.  Sunscreen use.  Safety issues such as seatbelt and bike helmet use.  Physical exam  Your health care provider will check your:  Height and weight. These may be used to calculate your BMI (body mass index). BMI is a measurement that tells if you are at a healthy weight.  Waist circumference. This measures the distance around your waistline. This measurement also tells if you are at a healthy weight and may help predict your risk of certain diseases, such as type 2 diabetes and high blood pressure.  Heart rate and blood pressure.  Body temperature.  Skin for abnormal spots.  What immunizations do I need?    Vaccines are usually given at various ages, according to a schedule. Your health care provider will recommend vaccines for you based on your age, medical history, and lifestyle or other factors, such as travel or where you work.  What tests do I need?  Screening  Your health care provider may recommend screening tests for certain conditions. This may include:  Lipid and cholesterol levels.  Diabetes screening. This is done by checking your blood sugar (glucose) after you have not eaten for a while (fasting).  Pelvic exam and Pap test.  Hepatitis B test.  Hepatitis C  test.  HIV (human immunodeficiency virus) test.  STI (sexually transmitted infection) testing, if you are at risk.  Lung cancer screening.  Colorectal cancer screening.  Mammogram. Talk with your health care provider about when you should start having regular mammograms. This may depend on whether you have a family history of breast cancer.  BRCA-related cancer screening. This may be done if you have a family history of breast, ovarian, tubal, or peritoneal cancers.  Bone density scan. This is done to screen for osteoporosis.  Talk with your health care provider about your test results, treatment options, and if necessary, the need for more tests.  Follow these instructions at home:  Eating and drinking    Eat a diet that includes fresh fruits and vegetables, whole grains, lean protein, and low-fat dairy products.  Take vitamin and mineral supplements as recommended by your health care provider.  Do not drink alcohol if:  Your health care provider tells you not to drink.  You are pregnant, may be pregnant, or are planning to become pregnant.  If you drink alcohol:  Limit how much you have to 0-1 drink a day.  Know how much alcohol is in your drink. In the [place] one drink equals one 12 oz bottle of beer (355 mL), one 5 oz glass of wine (148 mL), or one 1 oz glass of hard liquor (44 mL).  Lifestyle  Brush your teeth every morning and night with fluoride toothpaste. Floss one time each day.  Exercise for at least  30 minutes 5 or more days each week.  Do not use any products that contain nicotine or tobacco. These products include cigarettes, chewing tobacco, and vaping devices, such as e-cigarettes. If you need help quitting, ask your health care provider.  Do not use drugs.  If you are sexually active, practice safe sex. Use a condom or other form of protection to prevent STIs.  If you do not wish to become pregnant, use a form of birth control. If you plan to become pregnant, see your health care provider for a  prepregnancy visit.  Take aspirin only as told by your health care provider. Make sure that you understand how much to take and what form to take. Work with your health care provider to find out whether it is safe and beneficial for you to take aspirin daily.  Find healthy ways to manage stress, such as:  Meditation, yoga, or listening to music.  Journaling.  Talking to a trusted person.  Spending time with friends and family.  Minimize exposure to UV radiation to reduce your risk of skin cancer.  Safety  Always wear your seat belt while driving or riding in a vehicle.  Do not drive:  If you have been drinking alcohol. Do not ride with someone who has been drinking.  When you are tired or distracted.  While texting.  If you have been using any mind-altering substances or drugs.  Wear a helmet and other protective equipment during sports activities.  If you have firearms in your house, make sure you follow all gun safety procedures.  Seek help if you have been physically or sexually abused.  What's next?  Visit your health care provider once a year for an annual wellness visit.  Ask your health care provider how often you should have your eyes and teeth checked.  Stay up to date on all vaccines.  This information is not intended to replace advice given to you by your health care provider. Make sure you discuss any questions you have with your health care provider.  Document Revised: 10/17/2020 Document Reviewed: 10/17/2020  Elsevier Patient Education  2024 [employer].

## 2024-08-05 ENCOUNTER — Ambulatory Visit: Admitting: Internal Medicine

## 2024-08-08 ENCOUNTER — Ambulatory Visit: Admitting: Internal Medicine

## 2025-02-24 ENCOUNTER — Ambulatory Visit: Admitting: Radiology
